# Patient Record
Sex: Female | Born: 2000 | Race: Black or African American | Hispanic: No | Marital: Single | Smoking: Never smoker
Health system: Southern US, Community
[De-identification: ages and names within clinical notes are randomized; demographics above are authoritative.]

## PROBLEM LIST (undated history)

## (undated) DIAGNOSIS — Z789 Other specified health status: Secondary | ICD-10-CM

## (undated) HISTORY — DX: Other specified health status: Z78.9

## (undated) HISTORY — PX: OTHER SURGICAL HISTORY: SHX169

---
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
## 2021-06-18 ENCOUNTER — Emergency Department (HOSPITAL_COMMUNITY)
Admission: EM | Admit: 2021-06-18 | Discharge: 2021-06-18 | Disposition: A | Discharge status: Home / Self Care | Payer: Medicaid Other | Attending: Physician Assistant | Admitting: Physician Assistant

## 2021-06-18 ENCOUNTER — Emergency Department (HOSPITAL_COMMUNITY): Payer: Medicaid Other

## 2021-06-18 ENCOUNTER — Other Ambulatory Visit: Payer: Self-pay

## 2021-06-18 ENCOUNTER — Encounter (HOSPITAL_COMMUNITY): Payer: Self-pay | Admitting: Emergency Medicine

## 2021-06-18 DIAGNOSIS — M542 Cervicalgia: Secondary | ICD-10-CM | POA: Insufficient documentation

## 2021-06-18 DIAGNOSIS — M546 Pain in thoracic spine: Secondary | ICD-10-CM | POA: Insufficient documentation

## 2021-06-18 DIAGNOSIS — R519 Headache, unspecified: Secondary | ICD-10-CM | POA: Diagnosis present

## 2021-06-18 DIAGNOSIS — Z5321 Procedure and treatment not carried out due to patient leaving prior to being seen by health care provider: Secondary | ICD-10-CM | POA: Diagnosis not present

## 2021-06-18 DIAGNOSIS — Y92410 Unspecified street and highway as the place of occurrence of the external cause: Secondary | ICD-10-CM | POA: Diagnosis not present

## 2021-06-18 NOTE — ED Notes (Signed)
Patient left on own accord °
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
## 2021-08-04 ENCOUNTER — Ambulatory Visit: Payer: Self-pay | Admitting: Nurse Practitioner
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
## 2022-05-10 ENCOUNTER — Ambulatory Visit: Payer: Medicaid Other | Admitting: Nurse Practitioner

## 2022-07-20 ENCOUNTER — Inpatient Hospital Stay (HOSPITAL_COMMUNITY): Payer: Medicaid Other

## 2022-07-20 ENCOUNTER — Inpatient Hospital Stay (HOSPITAL_COMMUNITY)
Admission: AD | Admit: 2022-07-20 | Discharge: 2022-07-20 | Disposition: A | Discharge status: Home / Self Care | Payer: Medicaid Other | Attending: Obstetrics and Gynecology | Admitting: Obstetrics and Gynecology

## 2022-07-20 ENCOUNTER — Encounter (HOSPITAL_COMMUNITY): Payer: Self-pay

## 2022-07-20 ENCOUNTER — Other Ambulatory Visit: Payer: Self-pay

## 2022-07-20 DIAGNOSIS — Z3490 Encounter for supervision of normal pregnancy, unspecified, unspecified trimester: Secondary | ICD-10-CM

## 2022-07-20 DIAGNOSIS — R1010 Upper abdominal pain, unspecified: Secondary | ICD-10-CM | POA: Diagnosis not present

## 2022-07-20 DIAGNOSIS — Z3A09 9 weeks gestation of pregnancy: Secondary | ICD-10-CM | POA: Insufficient documentation

## 2022-07-20 DIAGNOSIS — R109 Unspecified abdominal pain: Secondary | ICD-10-CM | POA: Diagnosis present

## 2022-07-20 DIAGNOSIS — O26891 Other specified pregnancy related conditions, first trimester: Secondary | ICD-10-CM | POA: Diagnosis present

## 2022-07-20 LAB — CBC
HCT: 37.6 % (ref 36.0–46.0)
Hemoglobin: 12.4 g/dL (ref 12.0–15.0)
MCH: 28.2 pg (ref 26.0–34.0)
MCHC: 33 g/dL (ref 30.0–36.0)
MCV: 85.5 fL (ref 80.0–100.0)
Platelets: 348 10*3/uL (ref 150–400)
RBC: 4.4 MIL/uL (ref 3.87–5.11)
RDW: 14.2 % (ref 11.5–15.5)
WBC: 7.7 10*3/uL (ref 4.0–10.5)
nRBC: 0 % (ref 0.0–0.2)

## 2022-07-20 LAB — WET PREP, GENITAL
Clue Cells Wet Prep HPF POC: NONE SEEN
Sperm: NONE SEEN
Trich, Wet Prep: NONE SEEN
WBC, Wet Prep HPF POC: <10 (ref <10)
Yeast Wet Prep HPF POC: NONE SEEN

## 2022-07-20 LAB — HIV ANTIBODY (ROUTINE TESTING W REFLEX): HIV Screen 4th Generation wRfx: NONREACTIVE

## 2022-07-20 LAB — POC URINE PREG, ED: Preg Test, Ur: POSITIVE — AB

## 2022-07-20 LAB — ABO/RH: ABO/RH(D): B POS

## 2022-07-20 LAB — HCG, QUANTITATIVE, PREGNANCY: hCG, Beta Chain, Quant, S: 211507 m[IU]/mL — ABNORMAL HIGH (ref <5)

## 2022-07-20 NOTE — ED Provider Notes (Signed)
[facility] EMERGENCY DEPARTMENT Provider Note   CSN: [id number] Arrival date & time: 07/20/22  1425     History  No chief complaint on file.   Alexis Gray is a 21 y.o. female.  HPI Previously well female presents with upper abdominal pain in the context of home diagnosed pregnancy.  Last menstrual period was September 7.  She took a home pregnancy test a few weeks after that was positive.  Now over the past 2 or 3 weeks she has had abdominal pain.  In her effort to seek care she was informed that she needs a hospital performed pregnancy test.  She presents today for that with concern for the ongoing abdominal pain.  No vomiting, no bleeding.    Home Medications Prior to Admission medications   Medication Sig Start Date End Date Taking? Authorizing Provider  ibuprofen (ADVIL,MOTRIN) 100 MG chewable tablet Chew 4 tablets (400 mg total) by mouth every 8 (eight) hours as needed. Take with food 11/30/15   Triplett, Tammy, PA-C  ondansetron (ZOFRAN ODT) 4 MG disintegrating tablet Take 1 tablet (4 mg total) by mouth every 8 (eight) hours as needed for nausea or vomiting. 10/16/20   Mare Ferrari, PA-C      Allergies    Patient has no known allergies.    Review of Systems   Review of Systems  All other systems reviewed and are negative.   Physical Exam Updated Vital Signs BP 113/63   Pulse 86   Temp 97.9 F (36.6 C) (Oral)   Resp 15   SpO2 99%  Physical Exam Vitals and nursing note reviewed.  Constitutional:      General: She is not in acute distress.    Appearance: She is well-developed.  HENT:     Head: Normocephalic and atraumatic.  Eyes:     Conjunctiva/sclera: Conjunctivae normal.  Cardiovascular:     Rate and Rhythm: Normal rate and regular rhythm.  Pulmonary:     Effort: Pulmonary effort is normal. No respiratory distress.     Breath sounds: Normal breath sounds. No stridor.  Abdominal:     General: There is no distension.    Skin:     General: Skin is warm and dry.  Neurological:     Mental Status: She is alert and oriented to person, place, and time.     Cranial Nerves: No cranial nerve deficit.  Psychiatric:        Mood and Affect: Mood normal.     ED Results / Procedures / Treatments   Labs (all labs ordered are listed, but only abnormal results are displayed) Labs Reviewed  POC URINE PREG, ED    Procedures Procedures    Medications Ordered in ED Medications - No data to display  ED Course/ Medical Decision Making/ A&P                           Medical Decision Making Previously well female first pregnancy 10 weeks by dates presents with ongoing abdominal pain no prior OB eval.  Patient is awake, alert, afebrile, hemodynamically unremarkable, and pain is upper not lower, lower suspicion for ectopic or threatened pregnancy, but without prior evaluation and discussed her case with our MAU and the patient was sent there for evaluation.  Initial physical exam, vital signs reassuring.  Amount and/or Complexity of Data Reviewed Labs:  Decision-making details documented in ED Course.  Risk Decision regarding hospitalization.   Final  Clinical Impression(s) / ED Diagnoses Final diagnoses:  Abdominal pain during pregnancy in first trimester    Rx / DC Orders ED Discharge Orders     None         Gerhard Munch, MD 07/20/22 1448

## 2022-07-20 NOTE — MAU Note (Signed)
Alexis Gray is a 21 y.o. at [redacted]w[redacted]d here in MAU reporting: states she needs pregnancy confirmation to get her first appointment in the office. Having some abdominal pain that started 3 weeks ago but worse today. No bleeding.   LMP: 04/29/22  Onset of complaint: ongoing  Pain score: 4/10  Vitals:   07/20/22 1433 07/20/22 1534  BP: 113/63 98/69  Pulse: 86 88  Resp: 15 16  Temp: 97.9 F (36.6 C) 98.6 F (37 C)  SpO2: 99% 97%     [name]  Lab orders placed from triage: labs ordered by provider

## 2022-07-20 NOTE — MAU Provider Note (Signed)
History     CSN: [id number]  Arrival date and time: 07/20/22 1425    Chief Complaint  Patient presents with   Abdominal Pain   HPI Ms. Sophira L Flitton is a 21 y.o. year old G1P0 female at [redacted]w[redacted]d weeks gestation who presents to MAU reporting she needs pregnancy confirmation so she can get scheduled for her 1st prenatal visit. She reports she started having abdominal pain 3 weeks ago, but it worsened today. Pain is rated 4/10. She denies VB or abnormal vaginal d/c. She reports her LMP was 04/29/2022.    OB History     Gravida  1   Para      Term      Preterm      AB      Living         SAB      IAB      Ectopic      Multiple      Live Births              History reviewed. No pertinent past medical history.  History reviewed. No pertinent surgical history.  History reviewed. No pertinent family history.  Social History   Tobacco Use   Smoking status: Never   Smokeless tobacco: Never  Substance Use Topics   Alcohol use: No   Drug use: No    Allergies: No Known Allergies  Medications Prior to Admission  Medication Sig Dispense Refill Last Dose   ibuprofen (ADVIL,MOTRIN) 100 MG chewable tablet Chew 4 tablets (400 mg total) by mouth every 8 (eight) hours as needed. Take with food 30 tablet 0    ondansetron (ZOFRAN ODT) 4 MG disintegrating tablet Take 1 tablet (4 mg total) by mouth every 8 (eight) hours as needed for nausea or vomiting. 20 tablet 0     Review of Systems  Constitutional: Negative.   HENT: Negative.    Eyes: Negative.   Respiratory: Negative.    Cardiovascular: Negative.   Gastrointestinal: Negative.   Endocrine: Negative.   Genitourinary:  Positive for pelvic pain.  Musculoskeletal: Negative.   Allergic/Immunologic: Negative.   Neurological: Negative.   Hematological: Negative.   Psychiatric/Behavioral: Negative.     Physical Exam   Blood pressure 98/69, pulse 88, temperature 98.6 F (37 C), temperature source Oral, resp. rate  16, height 5\' 5"  (1.651 m), weight 56 kg, last menstrual period 04/29/2022, SpO2 97 %.  Physical Exam Vitals and nursing note reviewed.  Cardiovascular:     Rate and Rhythm: Normal rate.  Pulmonary:     Effort: Pulmonary effort is normal.  Abdominal:     General: Abdomen is flat.  Genitourinary:    Comments: Swabs collected by self-swab Musculoskeletal:        General: Normal range of motion.  Skin:    General: Skin is warm and dry.  Neurological:     Mental Status: She is oriented to person, place, and time.  Psychiatric:        Mood and Affect: Mood normal.        Behavior: Behavior normal.        Thought Content: Thought content normal.        Judgment: Judgment normal.     MAU Course  Procedures  MDM CCUA UPT CBC ABO/Rh HCG Wet Prep GC/CT -- pending HIV -- pending OB < 14 wks 06/29/2022 with TV  Results for orders placed or performed during the hospital encounter of 07/20/22 (from the past 24 hour(s))  POC Urine Pregnancy, ED (not at [facility])     Status: Abnormal   Collection Time: 07/20/22  2:36 PM  Result Value Ref Range   Preg Test, Ur POSITIVE (A) NEGATIVE  CBC     Status: None   Collection Time: 07/20/22  3:41 PM  Result Value Ref Range   WBC 7.7 4.0 - 10.5 K/uL   RBC 4.40 3.87 - 5.11 MIL/uL   Hemoglobin 12.4 12.0 - 15.0 g/dL   HCT 29.9 24.2 - 68.3 %   MCV 85.5 80.0 - 100.0 fL   MCH 28.2 26.0 - 34.0 pg   MCHC 33.0 30.0 - 36.0 g/dL   RDW 41.9 62.2 - 29.7 %   Platelets 348 150 - 400 K/uL   nRBC 0.0 0.0 - 0.2 %  ABO/Rh     Status: None   Collection Time: 07/20/22  3:41 PM  Result Value Ref Range   ABO/RH(D) B POS    No rh immune globuloin      NOT A RH IMMUNE GLOBULIN CANDIDATE, PT RH POSITIVE Performed at [facility], [address], [place], [place] [address]   hCG, quantitative, pregnancy     Status: Abnormal   Collection Time: 07/20/22  3:41 PM  Result Value Ref Range   hCG, Beta Chain, Quant, S 211,507 (H) <5 mIU/mL  HIV Antibody (routine  testing w rflx)     Status: None   Collection Time: 07/20/22  3:41 PM  Result Value Ref Range   HIV Screen 4th Generation wRfx Non Reactive Non Reactive  Wet prep, genital     Status: None   Collection Time: 07/20/22  3:52 PM  Result Value Ref Range   Yeast Wet Prep HPF POC NONE SEEN NONE SEEN   Trich, Wet Prep NONE SEEN NONE SEEN   Clue Cells Wet Prep HPF POC NONE SEEN NONE SEEN   WBC, Wet Prep HPF POC <10 <10   Sperm NONE SEEN     US OB LESS THAN 14 WEEKS WITH OB TRANSVAGINAL  Result Date: 07/20/2022 CLINICAL DATA:  Abdominal pain during first trimester pregnancy. Estimated gestational age by LMP is 11 weeks 5 days. Quantitative beta HCG is pending. EXAM: OBSTETRIC <14 WK [place] AND TRANSVAGINAL OB US TECHNIQUE: Both transabdominal and transvaginal ultrasound examinations were performed for complete evaluation of the gestation as well as the maternal uterus, adnexal regions, and pelvic cul-de-sac. Transvaginal technique was performed to assess early pregnancy. COMPARISON:  None Available. FINDINGS: Intrauterine gestational sac: A single intrauterine gestational sac is identified. Yolk sac:  The yolk sac is present. Embryo:  Fetal pole is visualized. Cardiac Activity: Fetal cardiac activity is observed. Heart Rate: 166 bpm CRL:  23.2 mm   9 w   0 d                  [place] EDC: 02/19/2023 Subchorionic hemorrhage:  None visualized. Maternal uterus/adnexae: Uterus is anteverted. No myometrial mass lesions are identified. Both ovaries are visualized and appear normal. No abnormal adnexal mass lesions. Minimal free fluid in the pelvis. IMPRESSION: Single intrauterine pregnancy with estimated gestational age by crown-rump length of 9 weeks 0 days. No acute complication is suggested sonographically. Electronically Signed   By: Burman Nieves M.D.   On: 07/20/2022 16:51     Assessment and Plan  1. Abdominal pain during pregnancy in first trimester - Information provided on abdominal pain in pregnancy  2.  Intrauterine pregnancy - List of OB providers who take Medicaid  given - Advised to call and get scheduled to start [facility] at 10-12 weeks  3. [redacted] weeks gestation of pregnancy   - Discharge patient - Patient verbalized an understanding of the plan of care and agrees.    Raelyn Mora, CNM 07/20/2022, 6:05 PM

## 2022-07-20 NOTE — ED Triage Notes (Signed)
Patient here with abdominal pain and requesting pregnancy test. LMP approximately ten weeks ago.

## 2022-07-20 NOTE — Discharge Instructions (Signed)
[facility] Prov[facility]   [facility]             [address], [place], [place] [address] [phone]  [facility]                                                             [address], [address], [place], [place], [address] [phone]  [facility] [place]                                    [address], [address], [place], [place], [address] [phone]  [facility] [place] [address], [place], [place], [address] [phone]  [facility] [place]                                 [address], [place], [place], [address] [phone]  [facility]                                    [address], , [place], [address] [phone]  [facility] [address], [address], [place], [place], [address]                              [facility] [address], [place], [place], [address] [phone]  [facility]         Phone: [phone]  [facility]      Phone: [phone]   [facility]      Phone: [phone]  [facility]         Phone: [phone]   [facility]    Phone: [phone]  [facility]      Phone: [phone]  [facility]      Phone: [phone]  [facility]        Phone: [phone] 

## 2022-07-21 LAB — GC/CHLAMYDIA PROBE AMP (~~LOC~~) NOT AT ARMC
Chlamydia: NEGATIVE
Comment: NEGATIVE
Comment: NORMAL
Neisseria Gonorrhea: NEGATIVE

## 2022-08-10 ENCOUNTER — Ambulatory Visit: Payer: Medicaid Other | Admitting: Family Medicine

## 2022-08-10 ENCOUNTER — Telehealth: Payer: Self-pay | Admitting: General Practice

## 2022-08-10 NOTE — Telephone Encounter (Signed)
Pt was a no show 12/13 for a New Patient visit with Dr. Doreene Burke. This is her first, letter sent

## 2022-08-11 NOTE — Telephone Encounter (Signed)
no show for new pt visit, [employer] insurance, unable to reschedule with [facility]

## 2022-08-17 ENCOUNTER — Telehealth (INDEPENDENT_AMBULATORY_CARE_PROVIDER_SITE_OTHER): Payer: Medicaid Other

## 2022-08-17 DIAGNOSIS — Z3490 Encounter for supervision of normal pregnancy, unspecified, unspecified trimester: Secondary | ICD-10-CM | POA: Insufficient documentation

## 2022-08-17 DIAGNOSIS — Z3492 Encounter for supervision of normal pregnancy, unspecified, second trimester: Secondary | ICD-10-CM

## 2022-08-17 DIAGNOSIS — Z3689 Encounter for other specified antenatal screening: Secondary | ICD-10-CM

## 2022-08-17 MED ORDER — BLOOD PRESSURE KIT DEVI: 1.0000 | 0 refills | Status: DC | PRN

## 2022-08-17 MED ORDER — GOJJI WEIGHT SCALE MISC: 1.0000 | 0 refills | Status: DC | PRN

## 2022-08-17 NOTE — Patient Instructions (Addendum)
Pick up blood pressure cuff and weight scale at : [place]  Child birth classes at [email or web address] Safe Medications in Pregnancy   Acne:  Benzoyl Peroxide  Salicylic Acid   Backache/Headache:  Tylenol: 2 regular strength every 4 hours OR               2 Extra strength every 6 hours   Colds/Coughs/Allergies:  Benadryl (alcohol free) 25 mg every 6 hours as needed  Breath right strips  Claritin  Cepacol throat lozenges  Chloraseptic throat spray  Cold-Eeze- up to three times per day  Cough drops, alcohol free  Flonase (by prescription only)  Guaifenesin  Mucinex  Robitussin DM (plain only, alcohol free)  Saline nasal spray/drops  Sudafed (pseudoephedrine) & Actifed * use only after [redacted] weeks gestation and if you do not have high blood pressure  Tylenol  Vicks Vaporub  Zinc lozenges  Zyrtec   Constipation:  Colace  Ducolax suppositories  Fleet enema  Glycerin suppositories  Metamucil  Milk of magnesia  Miralax  Senokot  Smooth move tea   Diarrhea:  Kaopectate  Imodium A-D   *NO pepto Bismol   Hemorrhoids:  Anusol  Anusol HC  Preparation H  Tucks   Indigestion:  Tums  Maalox  Mylanta  Zantac  Pepcid   Insomnia:  Benadryl (alcohol free) 25mg  every 6 hours as needed  Tylenol PM  Unisom, no Gelcaps   Leg Cramps:  Tums  MagGel   Nausea/Vomiting:  Bonine  Dramamine  Emetrol  Ginger extract  Sea bands  Meclizine  Nausea medication to take during pregnancy:  Unisom (doxylamine succinate 25 mg tablets) Take one tablet daily at bedtime. If symptoms are not adequately controlled, the dose can be increased to a maximum recommended dose of two tablets daily (1/2 tablet in the morning, 1/2 tablet mid-afternoon and one at bedtime).  Vitamin B6 100mg  tablets. Take one tablet twice a day (up to 200 mg per day).   Skin Rashes:  Aveeno products  Benadryl cream or 25mg  every 6 hours as needed  Calamine Lotion   1% cortisone cream   Yeast infection:  Gyne-lotrimin 7  Monistat 7    **If taking multiple medications, please check labels to avoid duplicating the same active ingredients  **take medication as directed on the label  ** Do not exceed 4000 mg of tylenol in 24 hours  **Do not take medications that contain aspirin or ibuprofen         AREA PEDIATRIC/FAMILY [place] [phone]) [facility] Erin Hearing, MD; Gwendlyn Deutscher, MD; Walker Kehr, MD; Andria Frames, MD; McDiarmid, MD; Dutch Quint, MD; Nori Riis, MD; Mingo Amber, [place] [address] [place], [place] [address] [phone] Mon-Fri 8:30-12:30, 1:30-5:00 Providers come to see babies at [place] [facility] providers who accept newborns: Dorthy Cooler, MD; Orland Mustard, MD; Stephanie Acre, MD [place] [address], [place], [place] [address] [phone] Mon-Fri 8:00-5:30 Babies seen by providers at [facility] Does NOT accept Medicaid Please call early in hospitalization for appointment (limited availability)  [place], MD [address] [place], [place] [address] [phone] Mon, Tue, Thur, Fri 8:30-5:00, Wed 10:00-7:00 (closed 1-2pm) Babies seen by [facility] providers Accepting Medicaid [place], [place]. [place], [place],  [address] [phone] Mon-Fri 8:30-5:00, Sat 8:30-12:00 Provider comes to see babies at [facility] Accepting Medicaid Must have been referred from current patients or contacted office  prior to delivery [place] [facility] [place]) Owens Shark, MD; Tamera Punt, MD; Doneen Poisson, MD; Fatima Sanger, MD; Wynetta Emery, MD; Jess Barters, MD; Tami Ribas, MD; Herbert Moors, MD; Derrell Lolling, MD; Dorothyann Peng, MD; Lucious Groves, NP; Baldo Ash, NP [address], [place], [place] [address] [phone] Mon, [place] Poot, Thur, Fri 8:30-5:30,  Wed 9:30-5:30, Sat 8:30-12:30 Babies seen by [facility] providers Accepting Medicaid Only accepting infants of first-time parents or siblings of current patients Hospital discharge coordinator will make follow-up appointment Baltazar Najjar [address], [place], [place]  [address] [phone]   Fax - [facility]. [address], [place], [place]  [address] Phone - [phone]   Fax - [place] [address] [place], [place], [place]  [address] [phone]  [place] [phone]) [facility] Redmond Baseman, MD; Jacklynn Ganong, MD; Torrie Mayers, MD; MD; Rosana Hoes, MD; Servando Salina, MD; Rose Fillers, MD; Rex Kras, MD; Corinna Capra, MD; Volney American, MD; Trilby Drummer, MD; Janann Colonel, MD; Jimmye Norman, [place], [place], [place] [address] [phone] Mon-Fri 8:30-5:00 (extended evenings Mon-Thur as needed), Sat-Sun 10:00-1:00 Providers come to see babies at [facility] Accepting Medicaid for families of first-time babies and families with all children in the household age 69 and under. Must register with office prior to making appointment (M-F only). [place], NP; Tomi Bamberger, MD; Redmond School, MD; [place], [place] [address] [place], [place] [address] [phone] Mon-Fri 8:00-5:00 Babies seen by providers at [facility] Does NOT accept Medicaid/Commercial Insurance Only [facility] [place] [facility]  Sabino Gasser, MD; Drema Dallas, MD; Montine Circle, MD; Vilma Prader, MD; Vanita Panda, MD; Alfonso Ramus, MD; Ruthann Cancer, MD; Roxanne Mins, MD; Rosalva Ferron, MD; Polly Cobia, MD [place]., [place], [place] [address] [phone] Mon-Fri 8:30-5:30, Sat (Oct.-Mar.) 9:00-1:00 Babies seen by providers at [place] [phone]) [place], MD; Suzan Slick, MD [address], [place], [place] [address] [phone] Mon-Fri 8:30-5:00, Sat  8:30-12:00 Providers come to see babies at [facility] Does NOT accept Medicaid [place] at Arlina Robes, [place]; Mannie Stabile, MD; [place], [place]; Nancy Fetter, MD; Moreen Fowler, Cornelius Pierre, [place], [place] [address] [phone] Mon-Fri 8:00-5:00 Babies seen by providers at [facility] Does NOT accept Medicaid Only accepting babies of parents who are patients Please call early in hospitalization for appointment (limited availability) [facility] Pediatricians Carlis Abbott, MD; Sharlene Motts, MD; Rod Can, MD; Warner Mccreedy, NP; Sabra Heck, MD; Ermalinda Memos, MD; Sharlett Iles, NP; Aurther Loft, MD; Jerrye Beavers, MD; Marcello Moores, MD; Berline Lopes, MD; Charolette Forward, MD [place]. [place], [place], [place] [address] [phone] Mon-Fri 8:00-5:00, Sat 9:00-12:00 Providers come to see babies at [facility] Does NOT accept [facility] [phone]) [place] at [facility] providers accepting new patients: Dayna Ramus, NP; Rolland Porter, [place], [place], [place] [address] [phone] Mon-Fri 8:00-5:00 Babies seen by providers at [facility] Does NOT accept Medicaid Only accepting babies of parents who are patients Please call early in hospitalization for appointment (limited availability) [facility] Abner Greenspan, MD; Sheran Lawless, MD [address] [place], [place] [address] [phone] (press 1 to schedule appointment) Mon-Fri 8:00-5:00 Providers come to see babies at [facility] Does NOT accept [facility], MD [address], [place], [place] [address] [phone] Mon-Fri 8:30-5:00 (lunch 12:30-1:00), extended hours by appointment only Wed 5:00-6:30 Babies seen by [facility] providers Accepting [facility] [place] at Nadeen Landau, MD; [place], MD; Ethlyn Gallery, [place], [place], [place] [address] [phone] Mon-Fri 8:00-5:00 Babies seen by [facility] providers Does NOT accept Medicaid [place]  at [place], MD; Yong Channel, MD; [place], [place] [address]., [place], [place] [address] [phone] Mon-Fri 8:00-5:00 Babies seen by [facility] providers Does NOT accept [facility] [place], [place]; [place], [place]; [place], [place]; Albertina Parr, MD; Frederic Jericho, MD; Ronney Lion, MD; Carlos Levering, NP; Jerelene Redden, NP; Tomasita Crumble, NP; Ronelle Nigh, NP; Corinna Lines, MD; [place], MD [address] [place], [place] [address] [phone] Mon-Fri 8:30-5:00, Sat 10:00-1:00 Providers come to see babies at [facility] Does NOT accept Medicaid Free prenatal information session Tuesdays at 4:45pm [place], MD; [place], [place]; [place], [place]; [place], [place]., [place] [address] [phone] Mon-Fri 7:30-5:30 Babies seen by [facility] [address] [place], [place], [place]  [address] [phone]   Fax - [phone]  [place] [phone] & [phone]) [facility] [address] [address] [place], [place] [address] [phone] Mon-Thur 8:00-6:00 Providers come to see babies at [place], NP; Melford Aase, MD; [place], [place]; [place], [place]., [place], [place] [address] [phone] Mon-Thur 7:30-7:30, Fri 7:30-4:30 Babies seen by [facility] providers Accepting [facility] Pediatrics Carolynn Sayers, MD; Cristino Martes, NP; Gertie Baron, MD [place], [place], [place] [address] [phone] Mon-Fri 8:30-5:00, Sat 8:30-12:00 Providers come to see babies at [facility] Accepting Medicaid Must have "Meet & Greet" appointment at office prior to delivery [place] ([place]) Faythe Dingwall, MD; Juleen China, MD; Clydene Laming, [place], [place], [place] [address] [phone] Mon-Wed 8:00-6:00, Thur-Fri 8:00-5:00, Sat 9:00-12:00 Providers come to see  babies at [facility] Does NOT accept Medicaid Only accepting siblings of current patients [facility], [employer], [place], [place]  [address] [phone]   Fax - [phone] [facility] [place]. [address], [place], [place]  [address] [phone]   Fax - [place] [phone] & [phone]) [place] at [facility], [place]; [place], [place] [address] [place], [place] [address] [phone] Mon-Fri 7:00-5:00 Babies seen by [facility] providers Does NOT accept Medicaid [place], MD; [place], [place]; [place], [place] [address], [place], [place] [address] [phone] Mon-Fri 8:00-5:00 Babies seen by [facility] providers Accepting [facility] Staves, MD; [place], [place]; [place], NP; [place], [place] [address] [place], [place], [place] [address] [phone] Mon-Fri 8:00-5:00 Babies seen by providers at [place] [phone]) [facility] at [place], [place] [address] Madelaine Bhat [place], [place] [address] [phone] Mon-Fri 8:00-5:00 Babies seen by [facility] providers Does NOT accept Medicaid Limited availability, please call early in hospitalization to schedule follow-up [facility] Kennedy Bucker, [place]; Maisie Fus, MD; Charlesetta Garibaldi, MD; [place], [place]; Jeannine Kitten, MD; [place], [place] [address], [place], [place] [address] [phone] Mon-Fri 8:30-5:00, Sat 9:00-12:00 Babies seen by providers at [facility] Accepting Medicaid Please register online then schedule online or call office [email or web address] [place] ([place] at [place]) Yong Channel, NP; Dwyane Dee, MD; Leonidas Romberg, [place] [address] [place], [place],   [address] [phone] Mon-Fri 8:00-5:00 Babies seen by providers at [place] ([place] [facility]) [place], MD; Rayvon Char, NP; Melina Modena, MD [phone], [place],  [place] [address] [phone] Mon-Fri 8:00-5:30, Sat&Sun by appointment (phones open at 8:30) Babies seen by [facility] providers Accepting Medicaid Must be a first-time baby or sibling of current patient [facility]  [address], [place], [place]  [address] [phone]   Fax - [phone]  [place] [address] [phone] & [phone]) [facility] [place], [place]; [place], [place]; [place], MD; [place], [place]; [place], MD [address], [place], [place] [address] [phone] Mon-Thur 8:00-7:00, Fri 8:00-5:00, Sat 8:00-12:00, Sun 9:00-12:00 Babies seen by [facility] providers Accepting Medicaid [facility] Medicine at Liana Gerold, MD; Gaynell Face, MD; [facility], MD [address], [place], [place] [address] [phone] Mon-Thur 8:00-5:00 Babies seen by providers at [facility] Accepting Medicaid [facility] Medicine at Dorthey Sawyer, MD; Coe[place]Go[place], MD; Madilyn Fireman, MD; Melvyn Neth, MD; [place], MD; Lazarus Salines, MD; Gaynell Face, MD; Berneda Rose, MD; Flora Lipps, MD; Beryl Meager, MD; Luther Redo, MD; Lavonia Drafts, MD; Kellie Simmering, MD [address] Sherian Maroon [place], [place] [address] [phone] Mon-Fri 8:00-5:30, Sat (Oct.-Mar.) 9:00-1:00 Babies seen by providers at [facility] Accepting Medicaid Must fill out new patient packet, available online at [email or web address] - [place] [facility] at [facility]) Spero Geralds, NP; Tiburcio Pea, NP; Tresa Endo, NP; Whitney Post, MD; [place], [place]; Hennie Duos, MD; [place], MD; Kavin Leech, NP [address], [place], [place] [address] [phone] Mon-Thur 8:00-5:30, Fri 8:00-5:00 Babies seen by providers at  [facility] Accepting [facility]  [place] [phone]) [facility] [place], [place]; [place], MD; [place], MD; [place], [place] [address] [place], [place] [address] [phone] Mon-Fri 8:00-5:00 Babies seen by providers at [facility] Accepting [facility]   [place] [phone]) [facility] at [facility], [place]; Lenise Arena, MD; [place], [place] [address], [place], [place] [address] [phone] Mon-Fri 8:00-5:00 Babies seen by providers at [facility] Does NOT accept Medicaid Limited appointment availability, please call early in hospitalization  [facility], [place]; [place], MD [address] [place], [place] [address] [phone] Mon-Fri 8:00-5:00 Babies seen by [facility] providers Does NOT accept [facility] - [facility], MD; Ninetta Lights, MD; [place], [place]; [place], MD [address] [facility] [address], [place], [place] [address] [phone] Mon-Fri 8:00-5:00 After hours clinic [facility] [place], [place] [address]) [phone] Mon-Fri 5:00-8:00, Sat 12:00-6:00, Sun 10:00-4:00 Babies seen by [facility] providers Accepting [facility] at [place] [facility] [address], [place], [place]  [address] [phone]   Fax - [phone]  [place] [phone]) Adult nurse HealthCare at [facility], MD [address] [place], [place], [place] [address] [phone] Mon-Fri 8:00-5:00 Babies seen by [facility] providers Does NOT accept Medicaid [facility] - [place] [facility] at [place]) Rene Kocher, MD [address] [place] [address] [place], [place] [address] [phone] Mon-Thur 8:00-7:00, Fri 8:00-5:00, Sat 8:00-12:00 Babies seen by providers at [facility] Accepting Medicaid - but does not have vaccinations in office (must be received elsewhere) Limited availability, please call early in hospitalization  [place]  [phone]) [facility]  Wyvonne Lenz, MD [address] [place] [address] [phone]  Fax [phone]  [facility]  Lyndel Safe, MD, [place], [place], [place], [place] [address] [place], [place] [address] [phone] [facility]  [address] Sherian [place], [place] [address] [phone] [address] [place], [place] [address] [phone]  Office)  [facility] [address] [place], [place] [address] [place], [place], [place] [address] [phone] [facility] [address] [place], [place], [place] [address] [phone] [facility] [address] [place], [place] [address] [place], [place], [place] [address] [facility] [address], [place], [place] [address] [id number] [place]. [address] [place], [place] [address] [phone] Dr. Okey Regal. [place] [address] [place], [place] [address] [facility] [address] [place] [address], [place], [place] [address] [facility] [address] [place], [place] [address] [phone]

## 2022-08-17 NOTE — Progress Notes (Signed)
New OB Intake  I connected with Alexis Gray on 08/17/22 at  9:15 AM EST by MyChart Video Visit and verified that I am speaking with the correct person using two identifiers. Nurse is located at [facility] and pt is located at home.  I discussed the limitations, risks, security and privacy concerns of performing an evaluation and management service by telephone and the availability of in person appointments. I also discussed with the patient that there may be a patient responsible charge related to this service. The patient expressed understanding and agreed to proceed.  I explained I am completing New OB Intake today. We discussed EDD of 02/22/2023 that is based on fist trimester ultrasound on 07/20/2022. Pt is G1/P0. I reviewed her allergies, medications, Medical/Surgical/OB history, and appropriate screenings. I informed her of [facility] services. [facility] information placed in AVS. Based on history, this is a low risk pregnancy.  There are no problems to display for this patient.   Concerns addressed today  Delivery Plans [facility]. Patient given information for [facility] Healthy Baby website for more information about Women's [facility] Children[facility] Center. Patient is not interested in water birth. Offered upcoming OB visit with CNM to discuss further.  MyChart/Babyscripts MyChart access verified. I explained pt will have some visits in office and some virtually. Babyscripts instructions given and order placed. Patient verifies receipt of registration text/e-mail. Account successfully created and app downloaded.  Blood Pressure Cuff/Weight Scale Blood pressure cuff ordered for patient to pick-up from [employer]. Explained after first prenatal appt pt will check weekly and document in Babyscripts. Patient does not have weight scale; order sent to [facility], patient may track weight weekly in Babyscripts.  Anatomy US Explained first scheduled [place] will be around 19 weeks. Anatomy US  scheduled for 09/28/2022 at 1:00. Pt notified to arrive at 12:45.  Labs Discussed Avelina Laine genetic screening with patient. Would like both Panorama and Horizon drawn at new OB visit. Routine prenatal labs needed.  COVID Vaccine Patient has not had COVID vaccine.   Is patient a CenteringPregnancy candidate?  Declined Declined due to Group setting   Is patient a Mom+Baby Combined Care candidate?  Not able to offer because not accepting patients for her due date   If accepted, Mom+Baby staff notified  Social Determinants of Health Food Insecurity: Patient denies food insecurity. WIC Referral: Patient is interested in referral to [facility].  Transportation: Patient denies transportation needs. Childcare: Discussed no children allowed at ultrasound appointments. Offered childcare services; patient declines childcare services at this time.  First visit review I reviewed new OB appt with patient. I explained they will have a provider visit that includes lab work. Explained pt will be seen by Dr. Mathews Robinsons at first visit; encounter routed to appropriate provider. Explained that patient will be seen by pregnancy navigator following visit with provider.   Lowry Bowl, CMA 08/17/2022  9:52 AM

## 2022-08-29 NOTE — L&D Delivery Note (Signed)
Delivery Note 22 y.o. G1P1001 at [redacted]w[redacted]d admitted for elective IOL.  At 1:09 PM a viable female was delivered via Vaginal, Spontaneous (Presentation: Right Occiput Anterior).  APGAR: 8, 9; weight 6 lb 10.2 oz (3010 g).   Placenta status: Spontaneous, Intact.  Cord: 3 vessels with the following complications: None.  Cord pH: not collected  Anesthesia: Epidural Episiotomy: None Lacerations: 2nd degree;Perineal Suture Repair: 2.0 vicryl Est. Blood Loss (mL): 327  Mom to postpartum.  Baby to Couplet care / Skin to Skin.  Sheppard Evens MD MPH OB Fellow, Faculty Practice [facility], Center for [facility] 02/17/2023

## 2022-09-02 ENCOUNTER — Ambulatory Visit (INDEPENDENT_AMBULATORY_CARE_PROVIDER_SITE_OTHER): Payer: Medicaid Other | Admitting: Advanced Practice Midwife

## 2022-09-02 ENCOUNTER — Other Ambulatory Visit (HOSPITAL_COMMUNITY)
Admission: RE | Admit: 2022-09-02 | Discharge: 2022-09-02 | Disposition: A | Discharge status: Home / Self Care | Payer: Medicaid Other | Source: Ambulatory Visit | Attending: Advanced Practice Midwife | Admitting: Advanced Practice Midwife

## 2022-09-02 ENCOUNTER — Other Ambulatory Visit: Payer: Self-pay

## 2022-09-02 ENCOUNTER — Encounter: Payer: Self-pay | Admitting: Advanced Practice Midwife

## 2022-09-02 VITALS — BP 104/65 | HR 75 | Wt 129.0 lb

## 2022-09-02 DIAGNOSIS — Z3492 Encounter for supervision of normal pregnancy, unspecified, second trimester: Secondary | ICD-10-CM | POA: Diagnosis present

## 2022-09-02 DIAGNOSIS — Z3A15 15 weeks gestation of pregnancy: Secondary | ICD-10-CM

## 2022-09-02 LAB — POCT URINALYSIS DIP (DEVICE)
Bilirubin Urine: NEGATIVE
Glucose, UA: NEGATIVE mg/dL
Hgb urine dipstick: NEGATIVE
Ketones, ur: NEGATIVE mg/dL
Nitrite: NEGATIVE
Protein, ur: NEGATIVE mg/dL
Specific Gravity, Urine: >=1.03 (ref 1.005–1.030)
Urobilinogen, UA: 0.2 mg/dL (ref 0.0–1.0)
pH: 5.5 (ref 5.0–8.0)

## 2022-09-02 MED ORDER — PRENATAL PLUS 27-1 MG PO TABS: 1.0000 | ORAL_TABLET | Freq: Every day | ORAL | 11 refills | Status: AC

## 2022-09-02 NOTE — Patient Instructions (Signed)

## 2022-09-02 NOTE — Progress Notes (Signed)
   INITIAL PRENATAL VISIT NOTE  Subjective:  Alexis Gray is a 22 y.o. G1P0 at [redacted]w[redacted]d being seen today for initial prenatal visit.  She is currently monitored for the following issues for this low-risk pregnancy and has Supervision of low-risk pregnancy on their problem list.  Patient reports headache.  Contractions: Not present. Vag. Bleeding: None.  Movement: Absent. Denies leaking of fluid.   The following portions of the patient's history were reviewed and updated as appropriate: allergies, current medications, past family history, past medical history, past social history, past surgical history and problem list.   Objective:   Vitals:   09/02/22 1041  BP: 104/65  Pulse: 75  Weight: 129 lb (58.5 kg)    Fetal Status: Fetal Heart Rate (bpm): 150 Fundal Height: 15 cm Movement: Absent     General:  Alert, oriented and cooperative. Patient is in no acute distress.  Skin: Skin is warm and dry. No rash noted.   Cardiovascular: Normal heart rate noted  Respiratory: Normal respiratory effort, no problems with respiration noted  Abdomen: Soft, gravid, appropriate for gestational age.  Pain/Pressure: Present     Pelvic: Cervical exam deferred        Extremities: Normal range of motion.  Edema: None  Mental Status: Normal mood and affect. Normal behavior. Normal judgment and thought content.   Assessment and Plan:  Pregnancy: G1P0 at [redacted]w[redacted]d 1. Encounter for supervision of low-risk pregnancy in second trimester - Culture, OB Urine - GC/Chlamydia probe amp ([place])not at [facility] - CBC/D/Plt+RPR+Rh+ABO+RubIgG... - Hemoglobin A1c - AFP, Serum, Open Spina Bifida - Panorama Prenatal Test Full Panel - [facility] Custom  2. [redacted] weeks gestation of pregnancy - pap deferred at this time due to patient's age.  Preterm labor symptoms and general obstetric precautions including but not limited to vaginal bleeding, contractions, leaking of fluid and fetal movement were reviewed in detail with the  patient. Please refer to After Visit Summary for other counseling recommendations.   Return in about 4 weeks (around 09/30/2022).  Future Appointments  Date Time Provider [place]  09/28/2022  1:00 PM [facility] US1 [facility]  10/03/2022 10:55 AM Autry-Lott, Naaman Plummer, DO [facility]    Marcille Buffy DNP, CNM  09/02/22  11:35 AM

## 2022-09-02 NOTE — Progress Notes (Signed)
Pt reports that she has been having a lot of Migrains, took Tylenol & it went away.

## 2022-09-04 LAB — CBC/D/PLT+RPR+RH+ABO+RUBIGG...
Antibody Screen: NEGATIVE
Basophils Absolute: 0 10*3/uL (ref 0.0–0.2)
Basos: 0 %
EOS (ABSOLUTE): 0 10*3/uL (ref 0.0–0.4)
Eos: 0 %
HCV Ab: NONREACTIVE
HIV Screen 4th Generation wRfx: NONREACTIVE
Hematocrit: 34.5 % (ref 34.0–46.6)
Hemoglobin: 11.4 g/dL (ref 11.1–15.9)
Hepatitis B Surface Ag: NEGATIVE
Immature Grans (Abs): 0 10*3/uL (ref 0.0–0.1)
Immature Granulocytes: 0 %
Lymphocytes Absolute: 1.9 10*3/uL (ref 0.7–3.1)
Lymphs: 26 %
MCH: 28.1 pg (ref 26.6–33.0)
MCHC: 33 g/dL (ref 31.5–35.7)
MCV: 85 fL (ref 79–97)
Monocytes Absolute: 0.5 10*3/uL (ref 0.1–0.9)
Monocytes: 7 %
Neutrophils Absolute: 4.9 10*3/uL (ref 1.4–7.0)
Neutrophils: 67 %
Platelets: 336 10*3/uL (ref 150–450)
RBC: 4.05 x10E6/uL (ref 3.77–5.28)
RDW: 13.9 % (ref 11.7–15.4)
RPR Ser Ql: NONREACTIVE
Rh Factor: POSITIVE
Rubella Antibodies, IGG: 12.6 index (ref >0.99)
WBC: 7.3 10*3/uL (ref 3.4–10.8)

## 2022-09-04 LAB — HCV INTERPRETATION

## 2022-09-04 LAB — AFP, SERUM, OPEN SPINA BIFIDA
AFP MoM: 0.74
AFP Value: 26.8 ng/mL
Gest. Age on Collection Date: 15 weeks
Maternal Age At EDD: 21 yr
OSBR Risk 1 IN: 10000
Test Results:: NEGATIVE
Weight: 129 [lb_av]

## 2022-09-04 LAB — URINE CULTURE, OB REFLEX

## 2022-09-04 LAB — HEMOGLOBIN A1C
Est. average glucose Bld gHb Est-mCnc: 105 mg/dL
Hgb A1c MFr Bld: 5.3 % (ref 4.8–5.6)

## 2022-09-04 LAB — CULTURE, OB URINE

## 2022-09-05 LAB — GC/CHLAMYDIA PROBE AMP (~~LOC~~) NOT AT ARMC
Chlamydia: NEGATIVE
Comment: NEGATIVE
Comment: NORMAL
Neisseria Gonorrhea: NEGATIVE

## 2022-09-09 LAB — PANORAMA PRENATAL TEST FULL PANEL:PANORAMA TEST PLUS 5 ADDITIONAL MICRODELETIONS: FETAL FRACTION: 7.7

## 2022-09-13 LAB — HORIZON CUSTOM: REPORT SUMMARY: POSITIVE — AB

## 2022-09-16 ENCOUNTER — Telehealth: Payer: Self-pay | Admitting: Lactation Services

## 2022-09-16 NOTE — Telephone Encounter (Signed)
Called patient in regards to [employer]. Received a message that call cannot be completed at this time. Will send My Chart Message.

## 2022-09-22 ENCOUNTER — Other Ambulatory Visit: Payer: Self-pay | Admitting: *Deleted

## 2022-09-22 DIAGNOSIS — D563 Thalassemia minor: Secondary | ICD-10-CM

## 2022-09-28 ENCOUNTER — Ambulatory Visit: Payer: Medicaid Other | Attending: Advanced Practice Midwife

## 2022-09-28 ENCOUNTER — Other Ambulatory Visit: Payer: Self-pay | Admitting: Advanced Practice Midwife

## 2022-09-28 DIAGNOSIS — Z3A19 19 weeks gestation of pregnancy: Secondary | ICD-10-CM | POA: Diagnosis not present

## 2022-09-28 DIAGNOSIS — Z3492 Encounter for supervision of normal pregnancy, unspecified, second trimester: Secondary | ICD-10-CM | POA: Diagnosis not present

## 2022-09-28 DIAGNOSIS — Z363 Encounter for antenatal screening for malformations: Secondary | ICD-10-CM | POA: Insufficient documentation

## 2022-09-28 DIAGNOSIS — D569 Thalassemia, unspecified: Secondary | ICD-10-CM | POA: Diagnosis not present

## 2022-09-28 DIAGNOSIS — O321XX0 Maternal care for breech presentation, not applicable or unspecified: Secondary | ICD-10-CM | POA: Diagnosis not present

## 2022-09-28 DIAGNOSIS — Z3490 Encounter for supervision of normal pregnancy, unspecified, unspecified trimester: Secondary | ICD-10-CM

## 2022-10-03 ENCOUNTER — Other Ambulatory Visit: Payer: Self-pay

## 2022-10-03 ENCOUNTER — Ambulatory Visit (INDEPENDENT_AMBULATORY_CARE_PROVIDER_SITE_OTHER): Payer: Medicaid Other | Admitting: Family Medicine

## 2022-10-03 VITALS — BP 105/62 | HR 83 | Wt 134.9 lb

## 2022-10-03 DIAGNOSIS — Z3A19 19 weeks gestation of pregnancy: Secondary | ICD-10-CM

## 2022-10-03 DIAGNOSIS — R519 Headache, unspecified: Secondary | ICD-10-CM

## 2022-10-03 DIAGNOSIS — D563 Thalassemia minor: Secondary | ICD-10-CM

## 2022-10-03 DIAGNOSIS — Z3492 Encounter for supervision of normal pregnancy, unspecified, second trimester: Secondary | ICD-10-CM

## 2022-10-03 NOTE — Progress Notes (Signed)
   PRENATAL VISIT NOTE  Subjective:  [place] is a 22 y.o. G1P0 at [redacted]w[redacted]d being seen today for ongoing prenatal care.  She is currently monitored for the following issues for this low-risk pregnancy and has Supervision of low-risk pregnancy on their problem list.  Patient reports  intermittent headaches, hx of migraine headaches. Endorses she is drinking a lot. No current headache .  Contractions: Irritability (cramping). Vag. Bleeding: None.  Movement: Present. Denies leaking of fluid.   The following portions of the patient's history were reviewed and updated as appropriate: allergies, current medications, past family history, past medical history, past social history, past surgical history and problem list.   Objective:   Vitals:   10/03/22 1112  BP: 105/62  Pulse: 83  Weight: 134 lb 14.4 oz (61.2 kg)    Fetal Status: Fetal Heart Rate (bpm): 147   Movement: Present     General:  Alert, oriented and cooperative. Patient is in no acute distress.  Skin: Skin is warm and dry. No rash noted.   Cardiovascular: Normal heart rate noted  Respiratory: Normal respiratory effort, no problems with respiration noted  Abdomen: Soft, gravid, appropriate for gestational age.  Pain/Pressure: Present (lower back)     Pelvic: Cervical exam deferred        Extremities: Normal range of motion.  Edema: None  Mental Status: Normal mood and affect. Normal behavior. Normal judgment and thought content.   Assessment and Plan:  Pregnancy: G1P0 at [redacted]w[redacted]d 1. Encounter for supervision of low-risk pregnancy in second trimester   2. [redacted] weeks gestation of pregnancy Follow up in 4 weeks  3. Frequent headaches Normal BP. Briefly discussed preeclampsia symptoms. Encouraged continued hydration.  4. Alpha thalassemia silent carrier GC appt scheduled as below. Patient reminded of this.    Preterm labor symptoms and general obstetric precautions including but not limited to vaginal bleeding, contractions,  leaking of fluid and fetal movement were reviewed in detail with the patient. Please refer to After Visit Summary for other counseling recommendations.   No follow-ups on file.  Future Appointments  Date Time Provider [place]  10/06/2022  3:00 PM [facility] GENETIC [facility] None    Naaman Plummer Autry-Lott, DO

## 2022-10-04 ENCOUNTER — Encounter: Payer: Self-pay | Admitting: Obstetrics and Gynecology

## 2022-10-06 ENCOUNTER — Telehealth: Payer: Self-pay | Admitting: Obstetrics and Gynecology

## 2022-10-06 ENCOUNTER — Ambulatory Visit: Payer: Medicaid Other | Attending: Maternal & Fetal Medicine

## 2022-10-06 ENCOUNTER — Other Ambulatory Visit: Payer: Self-pay

## 2022-10-06 ENCOUNTER — Ambulatory Visit: Payer: No Typology Code available for payment source

## 2022-10-06 NOTE — Telephone Encounter (Signed)
No show for virtual genetic counseling.  PC to offer to help with login or reschedule.  No answer.  Pt can reach out if she would like to reschedule.  We can be reached at [phone] or [phone].  Wilburt Finlay, MS, CGC

## 2022-11-01 ENCOUNTER — Ambulatory Visit (INDEPENDENT_AMBULATORY_CARE_PROVIDER_SITE_OTHER): Payer: Medicaid Other | Admitting: Advanced Practice Midwife

## 2022-11-01 ENCOUNTER — Encounter: Payer: Self-pay | Admitting: Advanced Practice Midwife

## 2022-11-01 ENCOUNTER — Other Ambulatory Visit (HOSPITAL_COMMUNITY)
Admission: RE | Admit: 2022-11-01 | Discharge: 2022-11-01 | Disposition: A | Discharge status: Home / Self Care | Payer: Medicaid Other | Source: Ambulatory Visit | Attending: Advanced Practice Midwife | Admitting: Advanced Practice Midwife

## 2022-11-01 ENCOUNTER — Other Ambulatory Visit: Payer: Self-pay

## 2022-11-01 VITALS — BP 106/56 | HR 79 | Wt 143.0 lb

## 2022-11-01 DIAGNOSIS — N760 Acute vaginitis: Secondary | ICD-10-CM | POA: Diagnosis present

## 2022-11-01 DIAGNOSIS — Z3A23 23 weeks gestation of pregnancy: Secondary | ICD-10-CM

## 2022-11-01 DIAGNOSIS — Z3492 Encounter for supervision of normal pregnancy, unspecified, second trimester: Secondary | ICD-10-CM

## 2022-11-01 MED ORDER — TERCONAZOLE 0.4 % VA CREA: 1.0000 | TOPICAL_CREAM | Freq: Every day | VAGINAL | 0 refills | Status: DC

## 2022-11-01 NOTE — Progress Notes (Signed)
   PRENATAL VISIT NOTE  Subjective:  Alexis Gray is a 22 y.o. G1P0 at 65w6dbeing seen today for ongoing prenatal care.  She is currently monitored for the following issues for this low-risk pregnancy and has Supervision of low-risk pregnancy on their problem list.  Patient reports vaginal irritation and vaginal discharge .  Contractions: Not present. Vag. Bleeding: None.  Movement: Present. Denies leaking of fluid.   The following portions of the patient's history were reviewed and updated as appropriate: allergies, current medications, past family history, past medical history, past social history, past surgical history and problem list.   Objective:   Vitals:   11/01/22 1456  BP: (!) 106/56  Pulse: 79  Weight: 143 lb (64.9 kg)    Fetal Status: Fetal Heart Rate (bpm): 139 Fundal Height: 24 cm Movement: Present     General:  Alert, oriented and cooperative. Patient is in no acute distress.  Skin: Skin is warm and dry. No rash noted.   Cardiovascular: Normal heart rate noted  Respiratory: Normal respiratory effort, no problems with respiration noted  Abdomen: Soft, gravid, appropriate for gestational age.  Pain/Pressure: Absent     Pelvic: Cervical exam deferred        Extremities: Normal range of motion.  Edema: None  Mental Status: Normal mood and affect. Normal behavior. Normal judgment and thought content.   Assessment and Plan:  Pregnancy: G1P0 at 268w6d. Acute vaginitis - wet prep collected - RX terazol 7 as directed   2. [redacted] weeks gestation of pregnancy - GTT at next visit   Preterm labor symptoms and general obstetric precautions including but not limited to vaginal bleeding, contractions, leaking of fluid and fetal movement were reviewed in detail with the patient. Please refer to After Visit Summary for other counseling recommendations.   Return in about 4 weeks (around 11/29/2022).  Future Appointments  Date Time Provider [place]4/08/2022  8:20 AM  [facility]4/08/2022  8:55 AM ArWoodroe ModeMD [facility]NP, CNM  11/01/22  3:26 PM

## 2022-11-02 LAB — CERVICOVAGINAL ANCILLARY ONLY
Bacterial Vaginitis (gardnerella): NEGATIVE
Candida Glabrata: NEGATIVE
Candida Vaginitis: POSITIVE — AB
Chlamydia: NEGATIVE
Comment: NEGATIVE
Comment: NEGATIVE
Comment: NEGATIVE
Comment: NEGATIVE
Comment: NEGATIVE
Comment: NORMAL
Neisseria Gonorrhea: NEGATIVE
Trichomonas: NEGATIVE

## 2022-11-09 ENCOUNTER — Other Ambulatory Visit: Payer: Self-pay

## 2022-11-09 ENCOUNTER — Encounter (HOSPITAL_COMMUNITY): Payer: Self-pay | Admitting: Obstetrics and Gynecology

## 2022-11-09 ENCOUNTER — Inpatient Hospital Stay (HOSPITAL_COMMUNITY)
Admission: AD | Admit: 2022-11-09 | Discharge: 2022-11-10 | Disposition: A | Discharge status: Home / Self Care | Payer: Medicaid Other | Attending: Obstetrics and Gynecology | Admitting: Obstetrics and Gynecology

## 2022-11-09 DIAGNOSIS — O26892 Other specified pregnancy related conditions, second trimester: Secondary | ICD-10-CM | POA: Diagnosis not present

## 2022-11-09 DIAGNOSIS — Z3A25 25 weeks gestation of pregnancy: Secondary | ICD-10-CM | POA: Diagnosis not present

## 2022-11-09 DIAGNOSIS — R109 Unspecified abdominal pain: Secondary | ICD-10-CM | POA: Diagnosis not present

## 2022-11-09 DIAGNOSIS — R1030 Lower abdominal pain, unspecified: Secondary | ICD-10-CM | POA: Insufficient documentation

## 2022-11-09 DIAGNOSIS — O26899 Other specified pregnancy related conditions, unspecified trimester: Secondary | ICD-10-CM

## 2022-11-09 NOTE — MAU Provider Note (Signed)
History    CSN: [id number]  Arrival date and time: 11/09/22 2318   Event Date/Time   First Provider Initiated Contact with Patient 11/09/22 2352     Chief Complaint  Patient presents with   Abdominal Pain   Decreased Fetal Movement   HPI  Alexis Gray is a 22 y.o. G1P0 at 7w0dwho presents for evaluation of lower abdominal cramping. Patient reports she is having intermittent lower abdominal cramping all evening.  Patient rates the pain as a 4/10 and has not tried anything for the pain. She denies any vaginal bleeding, discharge, and leaking of fluid. Denies any constipation, diarrhea or any urinary complaints. Reports less fetal movement over the last 2 days.   OB History     Gravida  1   Para      Term      Preterm      AB      Living         SAB      IAB      Ectopic      Multiple      Live Births              Past Medical History:  Diagnosis Date   Medical history non-contributory     Past Surgical History:  Procedure Laterality Date   windsom tooth      Family History  Problem Relation Age of Onset   Healthy Mother    Healthy Father     Social History   Tobacco Use   Smoking status: Never   Smokeless tobacco: Never  Vaping Use   Vaping Use: Never used  Substance Use Topics   Alcohol use: No   Drug use: No    Allergies: No Known Allergies  Medications Prior to Admission  Medication Sig Dispense Refill Last Dose   Blood Pressure Monitoring (BLOOD PRESSURE KIT) DEVI 1 each by Does not apply route as needed. 1 each 0    Misc. Devices ([place] MISC 1 each by Does not apply route as needed. (Patient not taking: Reported on 10/03/2022) 1 each 0    prenatal vitamin w/FE, FA (PRENATAL 1 + 1) 27-1 MG TABS tablet Take 1 tablet by mouth daily at 12 noon. 30 tablet 11    terconazole (TERAZOL 7) 0.4 % vaginal cream Place 1 applicator vaginally at bedtime. 45 g 0     Review of Systems  Constitutional: Negative.  Negative for  fatigue and fever.  HENT: Negative.    Respiratory: Negative.  Negative for shortness of breath.   Cardiovascular: Negative.  Negative for chest pain.  Gastrointestinal:  Positive for abdominal pain. Negative for constipation, diarrhea, nausea and vomiting.  Genitourinary: Negative.  Negative for dysuria, vaginal bleeding and vaginal discharge.  Neurological: Negative.  Negative for dizziness and headaches.   Physical Exam   Blood pressure 109/65, pulse 94, temperature 98.4 F (36.9 C), temperature source Oral, resp. rate 18, height '5\' 5"'$  (1.651 m), weight 67 kg, last menstrual period 04/29/2022, SpO2 100 %.  Patient Vitals for the past 24 hrs:  BP Temp Temp src Pulse Resp SpO2 Height Weight  11/09/22 2333 109/65 98.4 F (36.9 C) Oral 94 18 100 % '5\' 5"'$  (1.651 m) 67 kg    Physical Exam Vitals and nursing note reviewed.  Constitutional:      General: She is not in acute distress.    Appearance: She is well-developed.  HENT:     Head: Normocephalic.  Eyes:     Pupils: Pupils are equal, round, and reactive to light.  Cardiovascular:     Rate and Rhythm: Normal rate and regular rhythm.     Heart sounds: Normal heart sounds.  Pulmonary:     Effort: Pulmonary effort is normal. No respiratory distress.     Breath sounds: Normal breath sounds.  Abdominal:     General: Bowel sounds are normal. There is no distension.     Palpations: Abdomen is soft.     Tenderness: There is no abdominal tenderness.  Skin:    General: Skin is warm and dry.  Neurological:     Mental Status: She is alert and oriented to person, place, and time.  Psychiatric:        Mood and Affect: Mood normal.        Behavior: Behavior normal.        Thought Content: Thought content normal.        Judgment: Judgment normal.     Fetal Tracing:  Baseline: 150 Variability: moderate Accels: 10x10 Decels: none  Toco: none   MAU Course  Procedures  Results for orders placed or performed during the  hospital encounter of 11/09/22 (from the past 24 hour(s))  Urinalysis, Routine w reflex microscopic -Urine, Clean Catch     Status: Abnormal   Collection Time: 11/09/22 11:44 PM  Result Value Ref Range   Color, Urine YELLOW YELLOW   APPearance HAZY (A) CLEAR   Specific Gravity, Urine 1.025 1.005 - 1.030   pH 6.0 5.0 - 8.0   Glucose, UA NEGATIVE NEGATIVE mg/dL   Hgb urine dipstick NEGATIVE NEGATIVE   Bilirubin Urine NEGATIVE NEGATIVE   Ketones, ur NEGATIVE NEGATIVE mg/dL   Protein, ur NEGATIVE NEGATIVE mg/dL   Nitrite NEGATIVE NEGATIVE   Leukocytes,Ua NEGATIVE NEGATIVE   MDM Labs ordered and reviewed.   UA Discussed cervical exam to rule out preterm labor- patient declines. Reviewed abdominal pain in pregnancy could be labor and discussed this is the way to ensure no preterm labor. Patient verbalized understanding and declines.   Offered pain medication for pain and patient declines.  CNM reviewed high specific gravity in urine and reviewed importance of PO hydration in pregnancy  Assessment and Plan   1. Abdominal cramping affecting pregnancy   2. [redacted] weeks gestation of pregnancy     -Discharge home in stable condition -Second trimester precautions discussed -Patient advised to follow-up with OB as scheduled for prenatal care -Patient may return to MAU as needed or if her condition were to change or worsen  Wende Mott, CNM 11/09/2022, 11:52 PM

## 2022-11-09 NOTE — MAU Note (Signed)
.  Alexis Gray is a 22 y.o. at [redacted]w[redacted]d here in MAU reporting: baby has not moved in 2 days, lower abdominal cramping that wraps around to her back that started last night. Denies VB or LOF. Reports right leg feels numb all the way down - just started on the way here.   Onset of complaint: 2 days  Pain score: 6 Vitals:   11/09/22 2333  BP: 109/65  Pulse: 94  Resp: 18  Temp: 98.4 F (36.9 C)  SpO2: 100%     FHT:143 Lab orders placed from triage:  UA

## 2022-11-09 NOTE — MAU Provider Note (Incomplete)
History    CSN: [id number]  Arrival date and time: 11/09/22 2318   Event Date/Time   First Provider Initiated Contact with Patient 11/09/22 2352     Chief Complaint  Patient presents with  . Abdominal Pain  . Decreased Fetal Movement   HPI  Alexis Gray is a 22 y.o. G1P0 at 62w0dwho presents for evaluation of lower abdominal cramping. Patient reports she is having intermittent lower abdominal cramping all evening.  Patient rates the pain as a 4/10 and has not tried anything for the pain. She denies any vaginal bleeding, discharge, and leaking of fluid. Denies any constipation, diarrhea or any urinary complaints. Reports less fetal movement over the last 2 days.   OB History     Gravida  1   Para      Term      Preterm      AB      Living         SAB      IAB      Ectopic      Multiple      Live Births              Past Medical History:  Diagnosis Date  . Medical history non-contributory     Past Surgical History:  Procedure Laterality Date  . windsom tooth      Family History  Problem Relation Age of Onset  . Healthy Mother   . Healthy Father     Social History   Tobacco Use  . Smoking status: Never  . Smokeless tobacco: Never  Vaping Use  . Vaping Use: Never used  Substance Use Topics  . Alcohol use: No  . Drug use: No    Allergies: No Known Allergies  Medications Prior to Admission  Medication Sig Dispense Refill Last Dose  . Blood Pressure Monitoring (BLOOD PRESSURE KIT) DEVI 1 each by Does not apply route as needed. 1 each 0   . Misc. Devices ([place] MISC 1 each by Does not apply route as needed. (Patient not taking: Reported on 10/03/2022) 1 each 0   . prenatal vitamin w/FE, FA (PRENATAL 1 + 1) 27-1 MG TABS tablet Take 1 tablet by mouth daily at 12 noon. 30 tablet 11   . terconazole (TERAZOL 7) 0.4 % vaginal cream Place 1 applicator vaginally at bedtime. 45 g 0     Review of Systems Physical Exam   Blood  pressure 109/65, pulse 94, temperature 98.4 F (36.9 C), temperature source Oral, resp. rate 18, height '5\' 5"'$  (1.651 m), weight 67 kg, last menstrual period 04/29/2022, SpO2 100 %.  Patient Vitals for the past 24 hrs:  BP Temp Temp src Pulse Resp SpO2 Height Weight  11/09/22 2333 109/65 98.4 F (36.9 C) Oral 94 18 100 % '5\' 5"'$  (1.651 m) 67 kg    Physical Exam  Fetal Tracing:  Baseline: Variability: Accels: Decels:  Toco:      MAU Course  Procedures  No results found for this or any previous visit (from the past 24 hour(s)).   No results found.   MDM Labs ordered and reviewed.   ***  CNM independently reviewed the imaging ordered. Imaging show ***  CNM consulted with Dr. *Marland Kitchenregarding presentation and results- MD recommends  Assessment and Plan  No diagnosis found.  -Discharge home in stable condition -Rx for *** -*** precautions discussed -Patient advised to follow-up with *** -Patient may return to MAU as needed  or if her condition were to change or worsen  Wende Mott, [place] 11/09/2022, 11:52 PM

## 2022-11-10 DIAGNOSIS — O26892 Other specified pregnancy related conditions, second trimester: Secondary | ICD-10-CM

## 2022-11-10 DIAGNOSIS — Z3A25 25 weeks gestation of pregnancy: Secondary | ICD-10-CM

## 2022-11-10 DIAGNOSIS — R109 Unspecified abdominal pain: Secondary | ICD-10-CM

## 2022-11-10 LAB — URINALYSIS, ROUTINE W REFLEX MICROSCOPIC
Bilirubin Urine: NEGATIVE
Glucose, UA: NEGATIVE mg/dL
Hgb urine dipstick: NEGATIVE
Ketones, ur: NEGATIVE mg/dL
Leukocytes,Ua: NEGATIVE
Nitrite: NEGATIVE
Protein, ur: NEGATIVE mg/dL
Specific Gravity, Urine: 1.025 (ref 1.005–1.030)
pH: 6 (ref 5.0–8.0)

## 2022-11-10 NOTE — Discharge Instructions (Signed)

## 2022-11-28 ENCOUNTER — Other Ambulatory Visit: Payer: Medicaid Other

## 2022-11-28 ENCOUNTER — Ambulatory Visit (INDEPENDENT_AMBULATORY_CARE_PROVIDER_SITE_OTHER): Payer: Medicaid Other | Admitting: Obstetrics & Gynecology

## 2022-11-28 ENCOUNTER — Other Ambulatory Visit: Payer: Self-pay

## 2022-11-28 VITALS — BP 107/72 | HR 80 | Wt 152.8 lb

## 2022-11-28 DIAGNOSIS — Z3492 Encounter for supervision of normal pregnancy, unspecified, second trimester: Secondary | ICD-10-CM

## 2022-11-28 DIAGNOSIS — Z3A27 27 weeks gestation of pregnancy: Secondary | ICD-10-CM

## 2022-11-28 DIAGNOSIS — D563 Thalassemia minor: Secondary | ICD-10-CM

## 2022-11-28 NOTE — Progress Notes (Unsigned)
   PRENATAL VISIT NOTE  Subjective:  [place] is a 22 y.o. G1P0 at [redacted]w[redacted]d being seen today for ongoing prenatal care.  She is currently monitored for the following issues for this low-risk pregnancy and has Supervision of low-risk pregnancy on their problem list.  Patient reports no complaints.  Contractions: Not present. Vag. Bleeding: None.  Movement: Present. Denies leaking of fluid.   The following portions of the patient's history were reviewed and updated as appropriate: allergies, current medications, past family history, past medical history, past social history, past surgical history and problem list.   Objective:   Vitals:   11/28/22 0852  BP: 107/72  Pulse: 80  Weight: 152 lb 12.8 oz (69.3 kg)    Fetal Status: Fetal Heart Rate (bpm): 150   Movement: Present     General:  Alert, oriented and cooperative. Patient is in no acute distress.  Skin: Skin is warm and dry. No rash noted.   Cardiovascular: Normal heart rate noted  Respiratory: Normal respiratory effort, no problems with respiration noted  Abdomen: Soft, gravid, appropriate for gestational age.  Pain/Pressure: Absent     Pelvic: Cervical exam deferred        Extremities: Normal range of motion.  Edema: None  Mental Status: Normal mood and affect. Normal behavior. Normal judgment and thought content.   Assessment and Plan:  Pregnancy: G1P0 at [redacted]w[redacted]d 1. Encounter for supervision of low-risk pregnancy in second trimester Routine 28 week testing  Preterm labor symptoms and general obstetric precautions including but not limited to vaginal bleeding, contractions, leaking of fluid and fetal movement were reviewed in detail with the patient. Please refer to After Visit Summary for other counseling recommendations.   Return in about 3 weeks (around 12/19/2022).  Future Appointments  Date Time Provider [place]  12/06/2022  9:30 AM Farrel Conners, MD [facility]    Emeterio Reeve, MD

## 2022-11-29 LAB — CBC
Hematocrit: 30.7 % — ABNORMAL LOW (ref 34.0–46.6)
Hemoglobin: 9.8 g/dL — ABNORMAL LOW (ref 11.1–15.9)
MCH: 27.1 pg (ref 26.6–33.0)
MCHC: 31.9 g/dL (ref 31.5–35.7)
MCV: 85 fL (ref 79–97)
Platelets: 340 10*3/uL (ref 150–450)
RBC: 3.62 x10E6/uL — ABNORMAL LOW (ref 3.77–5.28)
RDW: 12.2 % (ref 11.7–15.4)
WBC: 8.5 10*3/uL (ref 3.4–10.8)

## 2022-11-29 LAB — HIV ANTIBODY (ROUTINE TESTING W REFLEX): HIV Screen 4th Generation wRfx: NONREACTIVE

## 2022-11-29 LAB — GLUCOSE TOLERANCE, 2 HOURS W/ 1HR
Glucose, 1 hour: 63 mg/dL — ABNORMAL LOW (ref 70–179)
Glucose, 2 hour: 90 mg/dL (ref 70–152)
Glucose, Fasting: 81 mg/dL (ref 70–91)

## 2022-11-29 LAB — RPR: RPR Ser Ql: NONREACTIVE

## 2022-11-30 ENCOUNTER — Encounter: Payer: Self-pay | Admitting: *Deleted

## 2022-12-02 ENCOUNTER — Encounter: Payer: Self-pay | Admitting: Family Medicine

## 2022-12-02 ENCOUNTER — Ambulatory Visit (INDEPENDENT_AMBULATORY_CARE_PROVIDER_SITE_OTHER): Payer: Medicaid Other | Admitting: Family Medicine

## 2022-12-02 VITALS — BP 98/60 | HR 100 | Temp 98.1°F | Ht 67.0 in | Wt 148.4 lb

## 2022-12-02 DIAGNOSIS — Z3A28 28 weeks gestation of pregnancy: Secondary | ICD-10-CM | POA: Diagnosis not present

## 2022-12-02 DIAGNOSIS — O99012 Anemia complicating pregnancy, second trimester: Secondary | ICD-10-CM | POA: Diagnosis not present

## 2022-12-02 DIAGNOSIS — J069 Acute upper respiratory infection, unspecified: Secondary | ICD-10-CM

## 2022-12-02 NOTE — Progress Notes (Signed)
New Patient Office Visit  Subjective    Patient ID: Alexis Gray, female    DOB: 08-Dec-2000  Age: 22 y.o. MRN: [id number]  CC:  Chief Complaint  Patient presents with   Establish Care    HPI Alexis Gray presents to establish care Pt reports she thinks se is running a little fever, states she woke up with a fever, stuffy nose. Pt reports her legs are feeling a bit numb, states that when she gets under the covers she starts to sweat. Reports a sore throat today. Some coughing last night, was coughing so bad she threw up last night. No itchy or watery eyes, no allergy symptoms or issues.   Pt reports she is taking her prenatal vitamins, however was told she was anemic after her last set of bloodwork.   She is not a smoker, no prior history of asthma or lung issues.   Pt is currently pregnant, her due date June 23rd. She reports that so far she has had a normal pregnancy course. Pt reports she is having a boy, this is her first pregnancy.  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   Outpatient Encounter Medications as of 12/02/2022  Medication Sig   Blood Pressure Monitoring (BLOOD PRESSURE KIT) DEVI 1 each by Does not apply route as needed.   Misc. Devices (GOJJI WEIGHT SCALE) MISC 1 each by Does not apply route as needed.   prenatal vitamin w/FE, FA (PRENATAL 1 + 1) 27-1 MG TABS tablet Take 1 tablet by mouth daily at 12 noon.   No facility-administered encounter medications on file as of 12/02/2022.    Past Medical History:  Diagnosis Date   Medical history non-contributory     Past Surgical History:  Procedure Laterality Date   windsom tooth      Family History  Problem Relation Age of Onset   Healthy Mother    Healthy Father     Social History   Socioeconomic History   Marital status: Single    Spouse name: Not on file   Number of children:  Not on file   Years of education: Not on file   Highest education level: Not on file  Occupational History   Not on file  Tobacco Use   Smoking status: Never   Smokeless tobacco: Never  Vaping Use   Vaping Use: Never used  Substance and Sexual Activity   Alcohol use: No   Drug use: No   Sexual activity: Yes    Birth control/protection: None  Other Topics Concern   Not on file  Social History Narrative   Not on file   Social Determinants of Health   Financial Resource Strain: Not on file  Food Insecurity: No Food Insecurity (11/28/2022)   Hunger Vital Sign    Worried About Running Out of Food in the Last Year: Never true    Ran Out of Food in the Last Year: Never true  Transportation Needs: No Transportation Needs (11/28/2022)   PRAPARE - Administrator, Civil ServiceTransportation    Lack of Transportation (Medical): No    Lack of Transportation (Non-Medical): No  Physical Activity: Not on file  Stress: Not on file  Social Connections: Not on file  Intimate Partner Violence: Not on file    Review of Systems  All other systems reviewed and are negative.  Objective    BP 98/60 (BP Location: Right Arm, Patient Position: Sitting, Cuff Size: Normal)   Pulse 100   Temp 98.1 F (36.7 C) (Oral)   Ht 5\' 7"  (1.702 m)   Wt 148 lb 6.4 oz (67.3 kg)   LMP 04/29/2022   SpO2 98%   BMI 23.24 kg/m   Physical Exam Vitals reviewed.  Constitutional:      Appearance: Normal appearance. She is well-groomed and normal weight.  HENT:     Right Ear: Tympanic membrane and ear canal normal.     Left Ear: Tympanic membrane and ear canal normal.     Mouth/Throat:     Mouth: Mucous membranes are moist.     Pharynx: No posterior oropharyngeal erythema.  Eyes:     Conjunctiva/sclera: Conjunctivae normal.  Cardiovascular:     Rate and Rhythm: Normal rate and regular rhythm.     Pulses: Normal pulses.     Heart sounds: S1 normal and S2 normal.  Pulmonary:     Effort: Pulmonary effort is normal.      Breath sounds: Normal breath sounds and air entry.  Abdominal:     Comments: Gravid uterus  Musculoskeletal:     Right lower leg: No edema.     Left lower leg: No edema.  Lymphadenopathy:     Cervical: No cervical adenopathy.  Neurological:     Mental Status: She is alert and oriented to person, place, and time. Mental status is at baseline.     Gait: Gait is intact.  Psychiatric:        Mood and Affect: Mood and affect normal.        Speech: Speech normal.        Behavior: Behavior normal.        Judgment: Judgment normal.     Last CBC Lab Results  Component Value Date   WBC 8.5 11/28/2022   HGB 9.8 (L) 11/28/2022   HCT 30.7 (L) 11/28/2022   MCV 85 11/28/2022   MCH 27.1 11/28/2022   RDW 12.2 11/28/2022   PLT 340 11/28/2022        Assessment & Plan:   Problem List Items Addressed This Visit   None Visit Diagnoses     Viral upper respiratory tract infection    -  Primary   Anemia of pregnancy in second trimester         Physical exam findings are normal except for some nasal congestion. We discussed OTC medications like cetirizine or loratadine that are safe for use in pregnancy, reminded patient not to use NSAIDS for fever, tylenol only. RTC 2 months after her delivery.   Anemia of pregnancy could be physiologic, she reports no SOB, heart racing or increasing fatigue, will defer to the OB in charge of her care. Pt reports she is compliant with her prenatal vitamins and her appetite is good.   Return in about 6 months (around 05/29/2023) for annual physical exam.   Karie Georges, MD

## 2022-12-02 NOTE — Patient Instructions (Signed)
Cetirizine  or Loratadine 10 mg once daily -- safe in pregnancy  Tylenol 500 mg every 4-6 hours as needed for fever

## 2022-12-06 ENCOUNTER — Ambulatory Visit: Payer: Medicaid Other | Admitting: Family Medicine

## 2022-12-19 ENCOUNTER — Ambulatory Visit (INDEPENDENT_AMBULATORY_CARE_PROVIDER_SITE_OTHER): Payer: Medicaid Other | Admitting: Student

## 2022-12-19 ENCOUNTER — Other Ambulatory Visit: Payer: Self-pay

## 2022-12-19 VITALS — BP 100/63 | HR 81 | Wt 156.2 lb

## 2022-12-19 DIAGNOSIS — Z3A30 30 weeks gestation of pregnancy: Secondary | ICD-10-CM

## 2022-12-19 DIAGNOSIS — O99013 Anemia complicating pregnancy, third trimester: Secondary | ICD-10-CM

## 2022-12-19 DIAGNOSIS — D563 Thalassemia minor: Secondary | ICD-10-CM

## 2022-12-19 DIAGNOSIS — O99019 Anemia complicating pregnancy, unspecified trimester: Secondary | ICD-10-CM

## 2022-12-19 DIAGNOSIS — Z3493 Encounter for supervision of normal pregnancy, unspecified, third trimester: Secondary | ICD-10-CM

## 2022-12-19 MED ORDER — FERROUS SULFATE 325 (65 FE) MG PO TBEC: 325.0000 mg | DELAYED_RELEASE_TABLET | ORAL | 1 refills | Status: AC

## 2022-12-19 NOTE — Progress Notes (Signed)
   PRENATAL VISIT NOTE  Subjective:  Alexis Gray is a 22 y.o. G1P0 at [redacted]w[redacted]d being seen today for ongoing prenatal care.  She is currently monitored for the following issues for this low-risk pregnancy and has Supervision of low-risk pregnancy and Alpha thalassemia silent carrier on their problem list.  Patient reports no complaints.  Contractions: Irritability. Vag. Bleeding: None.  Movement: Present. Denies leaking of fluid.   The following portions of the patient's history were reviewed and updated as appropriate: allergies, current medications, past family history, past medical history, past social history, past surgical history and problem list.   Objective:   Vitals:   12/19/22 1038  BP: 100/63  Pulse: 81  Weight: 156 lb 3.2 oz (70.9 kg)    Fetal Status: Fetal Heart Rate (bpm): 140   Movement: Present     General:  Alert, oriented and cooperative. Patient is in no acute distress.  Skin: Skin is warm and dry. No rash noted.   Cardiovascular: Normal heart rate noted  Respiratory: Normal respiratory effort, no problems with respiration noted  Abdomen: Soft, gravid, appropriate for gestational age.  Pain/Pressure: Present     Pelvic: Cervical exam deferred        Extremities: Normal range of motion.  Edema: None  Mental Status: Normal mood and affect. Normal behavior. Normal judgment and thought content.   Assessment and Plan:  Pregnancy: G1P0 at [redacted]w[redacted]d 1. Encounter for supervision of low-risk pregnancy in third trimester - Doing well, reports frequent and vigorous fetal movement  2. [redacted] weeks gestation of pregnancy - continue routine follow-up care  3. Alpha thalassemia silent carrier  4. Anemia affecting pregnancy, antepartum - recommend taking iron supplement, RX provided  Preterm labor symptoms and general obstetric precautions including but not limited to vaginal bleeding, contractions, leaking of fluid and fetal movement were reviewed in detail with the  patient. Please refer to After Visit Summary for other counseling recommendations.   Return in about 2 weeks (around 01/02/2023) for LOB, IN-PERSON.  No future appointments.   Corlis Hove, NP

## 2023-01-02 ENCOUNTER — Ambulatory Visit (INDEPENDENT_AMBULATORY_CARE_PROVIDER_SITE_OTHER): Payer: Medicaid Other | Admitting: Student

## 2023-01-02 ENCOUNTER — Other Ambulatory Visit: Payer: Self-pay

## 2023-01-02 VITALS — BP 111/62 | HR 96 | Wt 162.6 lb

## 2023-01-02 DIAGNOSIS — Z3493 Encounter for supervision of normal pregnancy, unspecified, third trimester: Secondary | ICD-10-CM

## 2023-01-02 DIAGNOSIS — O99013 Anemia complicating pregnancy, third trimester: Secondary | ICD-10-CM

## 2023-01-02 DIAGNOSIS — Z3A32 32 weeks gestation of pregnancy: Secondary | ICD-10-CM

## 2023-01-02 DIAGNOSIS — D563 Thalassemia minor: Secondary | ICD-10-CM

## 2023-01-02 DIAGNOSIS — O99019 Anemia complicating pregnancy, unspecified trimester: Secondary | ICD-10-CM | POA: Insufficient documentation

## 2023-01-02 NOTE — Patient Instructions (Addendum)
[place] [facility] Providers  [place] ([id number]) [facility] Brown, MD; Chambliss, MD; Eniola, MD; Hensel, MD; McDiarmid, MD; McIntyer, MD [address] [place], [place] [address] [phone] Mon-Fri 8:30-12:30, 1:30-5:00  Providers come to see babies during newborn hospitalization Only accepting infants of Mother's who are seen at Family Medicine Center or have siblings seen at   [facility] Medicaid - Yes; Tricare - Yes   [facility] Mulberry, MD [address] [place], [place] [address] [phone] Mon, Tue, Thur, Fri 8:30-5:00, Wed 10:00-7:00 (closed 1-2pm daily for lunch) Takes [place] residents with no insurance.  [place] [facility] only with Medicaid/insurance; Tricare - no  [facility] Ben-Davies, MD; Brown, MD; Chandler, MD; Ettefagh, MD; Grant, MD; Hanvey, MD; Herrin, MD; Jones,  MD; Lester, MD; McCormick, MD; McQueen, MD; Simha, MD; Stanley, MD; Stryffeler, NP [address], [place], [place] [address] [phone] Mon, Tue, Thur, Fri 8:30-5:30, Wed 9:30-5:30, Sat 8:30-12:30 Only accepting infants of first-time parents or siblings of current patients Hospital discharge coordinator will make follow-up appointment Medicaid - yes; Tricare - yes  [place] ([address]) [facility] Cox, MD; Davis, MD; Dovico, MD; Ettefaugh, MD; Lowe, MD; Nation, MD; Slimp, MD; Sumner, MD; Williams, MD [address], [place], [place] [address] [phone] Mon-Fri 8:30-5:00, closed for lunch 12:30-1:30; Sat-Sun 10:00-1:00 Accepting Newborns with commercial insurance only, must call prior to delivery to be accepted into  practice.  Medicaid - no, Tricare - yes   [facility] [address] [place], [place] [address] [phone] or [phone] Mon to Fri 8am to 10pm, Sat 8am to 1pm  (virtual only on weekends) Only accepts Medicaid Healthy Blue pts  [facility]  Artis, MD; Coccaro, MD; Lockett Gardner, MD; Netherton, NP; Roper, MD; Wilmot, PA-C; Skinner, MD [address] [place], [place] [address] [phone] Mon-Fri 8:30-5:30 Medicaid - yes, Tricare - yes  [place] ([address]) [facility] Warner, MD [address], [place], [place] [address] [phone] Mon, Tues, Wed Fri 8:30-5:00, Sat 8:30-12:00, Closed Thursdays Accepting siblings of established patients and first time mom's if you call prenatally Medicaid- yes; Tricare - yes  [facility] Becker, PA; Hagler, MD; Quinn, PA-C; Scifres, PA; Sun, MD; Swayne, MD;  [address], [place], [place] [address] [phone] Mon-Fri 8:30-5:00, closed for lunch 1-2 Only accepting newborns of established patients Medicaid- no; Tricare - yes  [place] ([address]) [facility] Timberlake, MD; [address], [place], [place] [address] [phone] Mon-Fri 8:00-5:00 Medicaid - No; Tricare - Yes  [facility] [employer]  Brake, NP; Wharton, PA [address], [place], [place] [address] [phone] Mon-Fri 8:00-5:00 Medicaid - No, Tricare - Yes  [facility] Gay, MD; Quinlan, MD; Blatt, DNP [address] [place], [place] [address] [phone]  Mon-Fri 8:00-5:00 Medicaid - No; Tricare - Yes  [facility] [address] [place], [place] [address] [phone] Mon-Fri 8:30-5:00 (lunch 12:00-1:00) Medicaid -Yes; Tricare - Yes  [facility] Jordan, MD [address], , [place] [address] [phone] Mon-Fri 8:00-5:00 Seeing newborns of current patients only. No new patients Medicaid - No, Tricare - yes  [facility] Parker, MD [address] , [place] [address] [phone] Mon-Fri 8:00-5:00 Medicaid -yes as secondary coverage only;   Tricare - yes  [facility] Brecken, PA; Christy, NP; Dees, MD; DeClaire, MD; DeWeese, MD; Hodge, PA; Smoot, NP; Summer, MD; Vapne, MD [address] [place], [place] [address] [phone] Mon-Fri 8:30-5:00, Sat 9:00-11:00 Accepts commercial insurance ONLY. Offers free prenatal information sessions for families. Medicaid - No, Tricare - Call first  [facility] Bouska, MD; Gordon, PA; Jeffery, PA; Weber, PA [address] [place] [address] [phone] Mon-Fri 7:30-5:30 Medicaid - Yes; Tricare - yes  [place] ([address] & [address])  [facility] Reese, MD [address] [place], [place] [address] [phone] Mon-Thur 8:00-6:00, closed for lunch 12-2, closed Fridays Medicaid - yes; Tricare - no  [facility] Anderson, NP; Badger, MD; Beal, PA; Spencer, PA [address], [place], [place] [address] [phone] Mon-Fri 7:30-4:30 Medicaid - yes, Tricare - yes  [facility]  Agbuya, MD; Klett, NP; Romgoolam, MD; Rothstein, NP [address], [place], [place] [address] [phone] Mon-Fri 8:30-5:00, closed for lunch 1-2, Sat 8:30-12:00 - sick visits only Providers come to see babies at [facility] Only accepting newborns of siblings and first time parents ONLY if who have met with office prior to delivery Medicaid -Yes; Tricare - yes  [facility]  Golden, DO; Friddle, NP; Wallace, MD; Wood, MD:  [address], [place], [place] [address] [phone] Mon- Fri 8:00-5:00, Sat 9:00-12:00 - sick visits only Accepting siblings of established patients and first time mom/baby Medicaid - Yes; Tricare - yes Patients must have vaccinations (baby vaccines)  [place] ([address]  [address])  [facility] [address] [place], [place] [address] [phone] Mon-Fri 8:00-5:00 Medicaid - no; Tricare - yes  [facility] Briscoe, MD; Schmidt, PA; Moreira, PA [address], [place], [place] [address] [phone] Mon-Fri 8:00-5:00 Medicaid- yes; Tricare - yes  [facility] Boyd, MD; Jones, NP; Osborn, PA [address], ,  [address] [phone] Mon-Fri 8:00-5:00 Medicaid - Yes; Tricare - yes  

## 2023-01-02 NOTE — Progress Notes (Signed)
   PRENATAL VISIT NOTE  Subjective:  Alexis Gray is a 22 y.o. G1P0 at [redacted]w[redacted]d being seen today for ongoing prenatal care.  She is currently monitored for the following issues for this low-risk pregnancy and has Supervision of low-risk pregnancy; Alpha thalassemia silent carrier; [redacted] weeks gestation of pregnancy; and Anemia affecting pregnancy on their problem list.  Patient reports backache.  Contractions: Irritability. Vag. Bleeding: None.  Movement: Present. Denies leaking of fluid.   The following portions of the patient's history were reviewed and updated as appropriate: allergies, current medications, past family history, past medical history, past social history, past surgical history and problem list.   Objective:   Vitals:   01/02/23 1647  BP: 111/62  Pulse: 96  Weight: 162 lb 9.6 oz (73.8 kg)    Fetal Status: Fetal Heart Rate (bpm): 146   Movement: Present     General:  Alert, oriented and cooperative. Patient is in no acute distress.  Skin: Skin is warm and dry. No rash noted.   Cardiovascular: Normal heart rate noted  Respiratory: Normal respiratory effort, no problems with respiration noted  Abdomen: Soft, gravid, appropriate for gestational age.  Pain/Pressure: Present     Pelvic: Cervical exam deferred        Extremities: Normal range of motion.  Edema: None  Mental Status: Normal mood and affect. Normal behavior. Normal judgment and thought content.   Assessment and Plan:  Pregnancy: G1P0 at [redacted]w[redacted]d 1. Encounter for supervision of low-risk pregnancy in third trimester - Doing well overall - Frequent and vigorous fetal movement - Pregnancy support belt provided  2. [redacted] weeks gestation of pregnancy - continue biweekly follow-up  3. Anemia affecting pregnancy in third trimester - Patient not able to take PO iron, recommend IV iron infusion  4. Alpha thalassemia silent carrier    Preterm labor symptoms and general obstetric precautions including but not limited to  vaginal bleeding, contractions, leaking of fluid and fetal movement were reviewed in detail with the patient. Please refer to After Visit Summary for other counseling recommendations.   Return in about 2 weeks (around 01/16/2023) for LOB, IN-PERSON.  Future Appointments  Date Time Provider Department Center  01/19/2023 10:55 AM Corlis Hove, NP [facility]    Corlis Hove, NP

## 2023-01-03 ENCOUNTER — Telehealth: Payer: Self-pay

## 2023-01-03 NOTE — Telephone Encounter (Signed)
Iron infusion scheduled per Vonita Moss, NP for Thursday, 01/05/23. Called pt; phone number not valid. MyChart message sent.

## 2023-01-04 ENCOUNTER — Other Ambulatory Visit: Payer: Self-pay | Admitting: Student

## 2023-01-04 DIAGNOSIS — D563 Thalassemia minor: Secondary | ICD-10-CM

## 2023-01-04 DIAGNOSIS — Z3493 Encounter for supervision of normal pregnancy, unspecified, third trimester: Secondary | ICD-10-CM

## 2023-01-04 DIAGNOSIS — O99013 Anemia complicating pregnancy, third trimester: Secondary | ICD-10-CM

## 2023-01-05 ENCOUNTER — Encounter (HOSPITAL_COMMUNITY): Payer: Medicaid Other

## 2023-01-18 ENCOUNTER — Non-Acute Institutional Stay (HOSPITAL_COMMUNITY)
Admission: RE | Admit: 2023-01-18 | Discharge: 2023-01-18 | Disposition: A | Discharge status: Home / Self Care | Payer: Medicaid Other | Source: Ambulatory Visit | Attending: Internal Medicine | Admitting: Internal Medicine

## 2023-01-18 ENCOUNTER — Encounter (HOSPITAL_COMMUNITY): Payer: Self-pay | Admitting: Student

## 2023-01-18 DIAGNOSIS — Z3493 Encounter for supervision of normal pregnancy, unspecified, third trimester: Secondary | ICD-10-CM

## 2023-01-18 DIAGNOSIS — O99013 Anemia complicating pregnancy, third trimester: Secondary | ICD-10-CM

## 2023-01-18 DIAGNOSIS — D563 Thalassemia minor: Secondary | ICD-10-CM

## 2023-01-18 MED ORDER — SODIUM CHLORIDE 0.9 % IV BOLUS: 500.0000 mL | Freq: Once | INTRAVENOUS | Status: DC | PRN

## 2023-01-18 MED ORDER — SODIUM CHLORIDE 0.9 % IV SOLN: INTRAVENOUS | Status: DC | PRN

## 2023-01-18 MED ORDER — IRON SUCROSE 500 MG IVPB - SIMPLE MED
500.0000 mg | INTRAVENOUS | Status: DC
  Filled 2023-01-18: qty 275

## 2023-01-18 MED ORDER — DIPHENHYDRAMINE HCL 50 MG/ML IJ SOLN: 25.0000 mg | Freq: Once | INTRAMUSCULAR | Status: DC | PRN

## 2023-01-18 MED ORDER — METHYLPREDNISOLONE SODIUM SUCC 125 MG IJ SOLR: 125.0000 mg | Freq: Once | INTRAMUSCULAR | Status: DC | PRN

## 2023-01-18 MED ORDER — EPINEPHRINE PF 1 MG/ML IJ SOLN: 0.3000 mg | Freq: Once | INTRAMUSCULAR | Status: DC | PRN

## 2023-01-18 MED ORDER — ALBUTEROL SULFATE (2.5 MG/3ML) 0.083% IN NEBU: 2.5000 mg | INHALATION_SOLUTION | Freq: Once | RESPIRATORY_TRACT | Status: DC | PRN

## 2023-01-18 NOTE — Progress Notes (Signed)
Pt came to clinic today to receive Venofer infusion. Pt was unaware that infusion was 4 hours, and is unable to stay for infusion today. Pt advised to reschedule for a day that she will have time to stay. Pt rescheduled infusion for next Friday, May 31st at 830 AM. Fleet Contras, RN from [facility] notified that pt could not stay today for infusion.

## 2023-01-19 ENCOUNTER — Ambulatory Visit (INDEPENDENT_AMBULATORY_CARE_PROVIDER_SITE_OTHER): Payer: Medicaid Other | Admitting: Student

## 2023-01-19 ENCOUNTER — Other Ambulatory Visit: Payer: Self-pay

## 2023-01-19 VITALS — BP 108/62 | HR 84 | Wt 164.0 lb

## 2023-01-19 DIAGNOSIS — O368130 Decreased fetal movements, third trimester, not applicable or unspecified: Secondary | ICD-10-CM

## 2023-01-19 DIAGNOSIS — Z23 Encounter for immunization: Secondary | ICD-10-CM

## 2023-01-19 DIAGNOSIS — L299 Pruritus, unspecified: Secondary | ICD-10-CM

## 2023-01-19 DIAGNOSIS — O99013 Anemia complicating pregnancy, third trimester: Secondary | ICD-10-CM

## 2023-01-19 DIAGNOSIS — O99019 Anemia complicating pregnancy, unspecified trimester: Secondary | ICD-10-CM

## 2023-01-19 DIAGNOSIS — Z3A35 35 weeks gestation of pregnancy: Secondary | ICD-10-CM

## 2023-01-19 DIAGNOSIS — D563 Thalassemia minor: Secondary | ICD-10-CM

## 2023-01-19 DIAGNOSIS — Z3493 Encounter for supervision of normal pregnancy, unspecified, third trimester: Secondary | ICD-10-CM

## 2023-01-20 ENCOUNTER — Other Ambulatory Visit: Payer: Self-pay | Admitting: Student

## 2023-01-20 DIAGNOSIS — L299 Pruritus, unspecified: Secondary | ICD-10-CM

## 2023-01-20 LAB — COMPREHENSIVE METABOLIC PANEL
ALT: 11 IU/L (ref 0–32)
AST: 14 IU/L (ref 0–40)
Albumin/Globulin Ratio: 1.2 (ref 1.2–2.2)
Albumin: 3.6 g/dL — ABNORMAL LOW (ref 4.0–5.0)
Alkaline Phosphatase: 156 IU/L — ABNORMAL HIGH (ref 42–106)
BUN/Creatinine Ratio: 9 (ref 9–23)
BUN: 5 mg/dL — ABNORMAL LOW (ref 6–20)
Bilirubin Total: <0.2 mg/dL (ref 0.0–1.2)
CO2: 21 mmol/L (ref 20–29)
Calcium: 9.2 mg/dL (ref 8.7–10.2)
Chloride: 100 mmol/L (ref 96–106)
Creatinine, Ser: 0.56 mg/dL — ABNORMAL LOW (ref 0.57–1.00)
Globulin, Total: 2.9 g/dL (ref 1.5–4.5)
Glucose: 72 mg/dL (ref 70–99)
Potassium: 4.4 mmol/L (ref 3.5–5.2)
Sodium: 134 mmol/L (ref 134–144)
Total Protein: 6.5 g/dL (ref 6.0–8.5)
eGFR: 134 mL/min/{1.73_m2} (ref >59)

## 2023-01-20 LAB — BILE ACIDS, TOTAL: Bile Acids Total: 3.5 umol/L (ref 0.0–10.0)

## 2023-01-20 NOTE — Progress Notes (Signed)
   PRENATAL VISIT NOTE  Subjective:  Alexis Gray is a 22 y.o. G1P0 at [redacted]w[redacted]d being seen today for ongoing prenatal care.  She is currently monitored for the following issues for this low-risk pregnancy and has Supervision of low-risk pregnancy; Alpha thalassemia silent carrier; [redacted] weeks gestation of pregnancy; and Anemia affecting pregnancy on their problem list.  Patient reports  itching to palms and feet with some upper abdomen discomfort .  Contractions: Not present. Vag. Bleeding: None.  Movement: (!) Decreased. Denies leaking of fluid.   The following portions of the patient's history were reviewed and updated as appropriate: allergies, current medications, past family history, past medical history, past social history, past surgical history and problem list.   Objective:   Vitals:   01/19/23 1108  BP: 108/62  Pulse: 84  Weight: 164 lb (74.4 kg)    Fetal Status: Fetal Heart Rate (bpm): 140   Movement: (!) Decreased     General:  Alert, oriented and cooperative. Patient is in no acute distress.  Skin: Skin is warm and dry. No rash noted.   Cardiovascular: Normal heart rate noted  Respiratory: Normal respiratory effort, no problems with respiration noted  Abdomen: Soft, gravid, appropriate for gestational age.  Pain/Pressure: Present     Pelvic: Cervical exam deferred        Extremities: Normal range of motion.  Edema: None  Mental Status: Normal mood and affect. Normal behavior. Normal judgment and thought content.   Assessment and Plan:  Pregnancy: G1P0 at [redacted]w[redacted]d 1. Encounter for supervision of low-risk pregnancy in third trimester - Discussed kick counts - Tdap vaccine greater than or equal to 7yo IM - Bile acids, total - Comp Met (CMET)  2. [redacted] weeks gestation of pregnancy - weekly follow-up - swabs next visit  3. Alpha thalassemia silent carrier   4. Anemia affecting pregnancy, antepartum - IV iron next week  5. Itching - on palms and feet with mild epigastric  pain - Bile acids, total - Comp Met (CMET)  6. Decreased fetal movements in third trimester, single or unspecified fetus - Reactive - Fetal nonstress test; Future  Preterm labor symptoms and general obstetric precautions including but not limited to vaginal bleeding, contractions, leaking of fluid and fetal movement were reviewed in detail with the patient. Please refer to After Visit Summary for other counseling recommendations.   No follow-ups on file.  Future Appointments  Date Time Provider Department Center  01/27/2023  8:30 AM [facility] None  02/01/2023 12:15 PM Klett, Pascal Lux, NP [facility]  02/02/2023  1:55 PM Adam Phenix, MD [facility]    Corlis Hove, NP

## 2023-01-27 ENCOUNTER — Non-Acute Institutional Stay (HOSPITAL_COMMUNITY)
Admission: RE | Admit: 2023-01-27 | Discharge: 2023-01-27 | Disposition: A | Discharge status: Home / Self Care | Payer: Medicaid Other | Source: Ambulatory Visit | Attending: Internal Medicine | Admitting: Internal Medicine

## 2023-01-27 VITALS — BP 104/57 | HR 88 | Temp 97.8°F | Resp 16

## 2023-01-27 DIAGNOSIS — O99013 Anemia complicating pregnancy, third trimester: Secondary | ICD-10-CM | POA: Insufficient documentation

## 2023-01-27 DIAGNOSIS — D563 Thalassemia minor: Secondary | ICD-10-CM

## 2023-01-27 DIAGNOSIS — Z3A32 32 weeks gestation of pregnancy: Secondary | ICD-10-CM

## 2023-01-27 DIAGNOSIS — Z3493 Encounter for supervision of normal pregnancy, unspecified, third trimester: Secondary | ICD-10-CM

## 2023-01-27 LAB — CBC
HCT: 31 % — ABNORMAL LOW (ref 36.0–46.0)
Hemoglobin: 9.5 g/dL — ABNORMAL LOW (ref 12.0–15.0)
MCH: 24.5 pg — ABNORMAL LOW (ref 26.0–34.0)
MCHC: 30.6 g/dL (ref 30.0–36.0)
MCV: 80.1 fL (ref 80.0–100.0)
Platelets: 412 10*3/uL — ABNORMAL HIGH (ref 150–400)
RBC: 3.87 MIL/uL (ref 3.87–5.11)
RDW: 15.2 % (ref 11.5–15.5)
WBC: 9.2 10*3/uL (ref 4.0–10.5)
nRBC: 0 % (ref 0.0–0.2)

## 2023-01-27 LAB — IRON AND TIBC
Iron: 37 ug/dL (ref 28–170)
Saturation Ratios: 5 % — ABNORMAL LOW (ref 10.4–31.8)
TIBC: 682 ug/dL — ABNORMAL HIGH (ref 250–450)
UIBC: 645 ug/dL

## 2023-01-27 LAB — FERRITIN: Ferritin: 6 ng/mL — ABNORMAL LOW (ref 11–307)

## 2023-01-27 MED ORDER — IRON SUCROSE 500 MG IVPB - SIMPLE MED
500.0000 mg | Freq: Once | INTRAVENOUS | Status: AC
  Administered 2023-01-27: 500 mg via INTRAVENOUS
  Filled 2023-01-27: qty 500

## 2023-01-27 MED ORDER — DIPHENHYDRAMINE HCL 25 MG PO CAPS
25.0000 mg | ORAL_CAPSULE | Freq: Once | ORAL | Status: AC
  Administered 2023-01-27: 25 mg via ORAL
  Filled 2023-01-27: qty 1

## 2023-01-27 MED ORDER — SODIUM CHLORIDE 0.9 % IV SOLN: INTRAVENOUS | Status: DC | PRN

## 2023-01-27 NOTE — Progress Notes (Signed)
PATIENT CARE CENTER NOTE  Diagnosis: Anemia affecting pregnancy in third trimester   Provider: Corlis Hove, NP   Procedure: Venofer 500 mg (#1 of 2)  Note: Patient received Venofer 500 mg infusion via PIV. Pt had about 4 minutes left in infusion, when she called out to RN to report that she felt a tingling, burning, and itching sensation in legs. Pt reports also feeling this is arms. Infusion stopped, and vital signs obtained. Vital signs stable. Pt has PRN medications ordered in case of a reaction, but patient refused anything by IV. RN called [employer] for [employer]. Per Dr. Mariel Aloe, pt can get the PRN IV medications, or she could get PO Benadryl 25 mg. Pt agreed that she would take Benadryl PO. Pt observed for 45 minutes after getting Benadryl and Vital signs remain stable. Pt advised if symptoms get worse, or pt develops any new symptoms that she should seek medical care, pt verbalized understanding. Pt advised to follow up with ordering provider about receiving second dose. Pt is alert, oriented, and ambulatory at discharge. Pt discharged home with friend.

## 2023-01-30 ENCOUNTER — Other Ambulatory Visit: Payer: Self-pay | Admitting: Student

## 2023-01-30 DIAGNOSIS — O99019 Anemia complicating pregnancy, unspecified trimester: Secondary | ICD-10-CM

## 2023-02-01 ENCOUNTER — Ambulatory Visit (INDEPENDENT_AMBULATORY_CARE_PROVIDER_SITE_OTHER): Payer: Self-pay | Admitting: Pediatrics

## 2023-02-01 DIAGNOSIS — Z7681 Expectant parent(s) prebirth pediatrician visit: Secondary | ICD-10-CM

## 2023-02-02 ENCOUNTER — Other Ambulatory Visit: Payer: Self-pay

## 2023-02-02 ENCOUNTER — Other Ambulatory Visit (HOSPITAL_COMMUNITY)
Admission: RE | Admit: 2023-02-02 | Discharge: 2023-02-02 | Disposition: A | Discharge status: Home / Self Care | Payer: Medicaid Other | Source: Ambulatory Visit | Attending: Obstetrics & Gynecology | Admitting: Obstetrics & Gynecology

## 2023-02-02 ENCOUNTER — Ambulatory Visit (INDEPENDENT_AMBULATORY_CARE_PROVIDER_SITE_OTHER): Payer: Medicaid Other | Admitting: Obstetrics & Gynecology

## 2023-02-02 VITALS — BP 104/69 | HR 98 | Wt 171.0 lb

## 2023-02-02 DIAGNOSIS — Z7681 Expectant parent(s) prebirth pediatrician visit: Secondary | ICD-10-CM | POA: Insufficient documentation

## 2023-02-02 DIAGNOSIS — Z3492 Encounter for supervision of normal pregnancy, unspecified, second trimester: Secondary | ICD-10-CM

## 2023-02-02 DIAGNOSIS — Z3493 Encounter for supervision of normal pregnancy, unspecified, third trimester: Secondary | ICD-10-CM

## 2023-02-02 DIAGNOSIS — Z3A37 37 weeks gestation of pregnancy: Secondary | ICD-10-CM

## 2023-02-02 DIAGNOSIS — D563 Thalassemia minor: Secondary | ICD-10-CM

## 2023-02-02 NOTE — Progress Notes (Signed)
Prenatal counseling for impending newborn done-- Z76.81  

## 2023-02-02 NOTE — Progress Notes (Signed)
Pt reports Allergic reaction when she was getting Iron infusion.

## 2023-02-02 NOTE — Progress Notes (Signed)
   PRENATAL VISIT NOTE  Subjective:  Alexis Gray is a 22 y.o. G1P0 at [redacted]w[redacted]d being seen today for ongoing prenatal care.  She is currently monitored for the following issues for this low-risk pregnancy and has Supervision of low-risk pregnancy; Alpha thalassemia silent carrier; [redacted] weeks gestation of pregnancy; and Anemia affecting pregnancy on their problem list.  Patient reports no complaints.  Contractions: Irritability. Vag. Bleeding: None.  Movement: Present. Denies leaking of fluid.   The following portions of the patient's history were reviewed and updated as appropriate: allergies, current medications, past family history, past medical history, past social history, past surgical history and problem list.   Objective:   Vitals:   02/02/23 1422  BP: 104/69  Pulse: 98  Weight: 171 lb (77.6 kg)    Fetal Status: Fetal Heart Rate (bpm): 150   Movement: Present     General:  Alert, oriented and cooperative. Patient is in no acute distress.  Skin: Skin is warm and dry. No rash noted.   Cardiovascular: Normal heart rate noted  Respiratory: Normal respiratory effort, no problems with respiration noted  Abdomen: Soft, gravid, appropriate for gestational age.  Pain/Pressure: Present     Pelvic: Cervical exam deferred        Extremities: Normal range of motion.  Edema: Trace  Mental Status: Normal mood and affect. Normal behavior. Normal judgment and thought content.   Assessment and Plan:  Pregnancy: G1P0 at [redacted]w[redacted]d 1. Alpha thalassemia silent carrier   2. Encounter for supervision of low-risk pregnancy in second trimester  - GC/Chlamydia probe amp ([place])not at [facility] - Culture, beta strep (group b only)  Term labor symptoms and general obstetric precautions including but not limited to vaginal bleeding, contractions, leaking of fluid and fetal movement were reviewed in detail with the patient. Please refer to After Visit Summary for other counseling recommendations.   Return  in about 1 week (around 02/09/2023).  Future Appointments  Date Time Provider Department Center  02/09/2023  9:15 AM Venora Maples, MD [facility]    Scheryl Darter, MD

## 2023-02-03 LAB — GC/CHLAMYDIA PROBE AMP (~~LOC~~) NOT AT ARMC
Chlamydia: NEGATIVE
Comment: NEGATIVE
Comment: NORMAL
Neisseria Gonorrhea: NEGATIVE

## 2023-02-05 LAB — CULTURE, BETA STREP (GROUP B ONLY): Strep Gp B Culture: POSITIVE — AB

## 2023-02-06 ENCOUNTER — Other Ambulatory Visit: Payer: Self-pay

## 2023-02-06 ENCOUNTER — Inpatient Hospital Stay (HOSPITAL_COMMUNITY)
Admission: AD | Admit: 2023-02-06 | Discharge: 2023-02-06 | Disposition: A | Discharge status: Home / Self Care | Payer: Medicaid Other | Attending: Obstetrics and Gynecology | Admitting: Obstetrics and Gynecology

## 2023-02-06 ENCOUNTER — Encounter (HOSPITAL_COMMUNITY): Payer: Self-pay | Admitting: Obstetrics and Gynecology

## 2023-02-06 DIAGNOSIS — O99013 Anemia complicating pregnancy, third trimester: Secondary | ICD-10-CM

## 2023-02-06 DIAGNOSIS — Z3A32 32 weeks gestation of pregnancy: Secondary | ICD-10-CM

## 2023-02-06 DIAGNOSIS — Z3A37 37 weeks gestation of pregnancy: Secondary | ICD-10-CM | POA: Diagnosis not present

## 2023-02-06 DIAGNOSIS — D563 Thalassemia minor: Secondary | ICD-10-CM | POA: Insufficient documentation

## 2023-02-06 DIAGNOSIS — Z3493 Encounter for supervision of normal pregnancy, unspecified, third trimester: Secondary | ICD-10-CM | POA: Diagnosis not present

## 2023-02-06 DIAGNOSIS — Z3492 Encounter for supervision of normal pregnancy, unspecified, second trimester: Secondary | ICD-10-CM

## 2023-02-06 NOTE — OB Triage Provider Note (Signed)
I have communicated with Rodena Piety, cnm and reviewed vital signs:  Vitals:   02/06/23 1937 02/06/23 2041  BP: 100/60 111/62  Pulse: 93 83  Resp: 16   Temp: (!) 97.4 F (36.3 C)     Vaginal exam:  Dilation: 1.5 Effacement (%): 80 Station: -2 Presentation: Vertex Exam by:: Karl Ito, rnc,   Also reviewed contraction pattern and that non-stress test is reactive.  It has been documented that patient is contracting occasionally not indicating labor.  Patient denies any other complaints.  Based on this report provider has given order for discharge.  A discharge order and diagnosis entered by a provider.   Labor discharge instructions reviewed with patient.

## 2023-02-06 NOTE — MAU Note (Addendum)
Pt says UC strong since 330pm [facility]- clinic Denies HSV GBS- positive No VE in office

## 2023-02-09 ENCOUNTER — Ambulatory Visit (INDEPENDENT_AMBULATORY_CARE_PROVIDER_SITE_OTHER): Payer: Medicaid Other | Admitting: Family Medicine

## 2023-02-09 ENCOUNTER — Encounter: Payer: Self-pay | Admitting: Family Medicine

## 2023-02-09 ENCOUNTER — Other Ambulatory Visit: Payer: Self-pay

## 2023-02-09 VITALS — BP 107/71 | HR 94 | Wt 174.6 lb

## 2023-02-09 DIAGNOSIS — Z3493 Encounter for supervision of normal pregnancy, unspecified, third trimester: Secondary | ICD-10-CM

## 2023-02-09 DIAGNOSIS — O99820 Streptococcus B carrier state complicating pregnancy: Secondary | ICD-10-CM

## 2023-02-09 DIAGNOSIS — Z3A38 38 weeks gestation of pregnancy: Secondary | ICD-10-CM

## 2023-02-09 DIAGNOSIS — O99013 Anemia complicating pregnancy, third trimester: Secondary | ICD-10-CM

## 2023-02-09 NOTE — Patient Instructions (Signed)

## 2023-02-09 NOTE — Progress Notes (Signed)
   Subjective:  Alexis Gray is a 22 y.o. G1P0 at [redacted]w[redacted]d being seen today for ongoing prenatal care.  She is currently monitored for the following issues for this low-risk pregnancy and has Supervision of low-risk pregnancy; Alpha thalassemia silent carrier; [redacted] weeks gestation of pregnancy; Anemia affecting pregnancy; Pediatric pre-birth visit for expectant parent; and GBS (group B Streptococcus carrier), +RV culture, currently pregnant on their problem list.  Patient reports no complaints.  Contractions: Irritability. Vag. Bleeding: None.  Movement: Present. Denies leaking of fluid.   The following portions of the patient's history were reviewed and updated as appropriate: allergies, current medications, past family history, past medical history, past social history, past surgical history and problem list. Problem list updated.  Objective:   Vitals:   02/09/23 0911  BP: 107/71  Pulse: 94  Weight: 174 lb 9.6 oz (79.2 kg)    Fetal Status: Fetal Heart Rate (bpm): 137 Fundal Height: 38 cm Movement: Present     General:  Alert, oriented and cooperative. Patient is in no acute distress.  Skin: Skin is warm and dry. No rash noted.   Cardiovascular: Normal heart rate noted  Respiratory: Normal respiratory effort, no problems with respiration noted  Abdomen: Soft, gravid, appropriate for gestational age. Pain/Pressure: Present     Pelvic: Vag. Bleeding: None     Cervical exam deferred        Extremities: Normal range of motion.  Edema: None  Mental Status: Normal mood and affect. Normal behavior. Normal judgment and thought content.   Urinalysis:      Assessment and Plan:  Pregnancy: G1P0 at [redacted]w[redacted]d  1. Encounter for supervision of low-risk pregnancy in third trimester BP and FHR normal FH appropriate Strongly desires 39 week IOL, discussed this would be elective and she is not guaranteed to get in before 41 weeks Orders placed, form faxed  2. GBS (group B Streptococcus carrier), +RV  culture, currently pregnant Discussed ppx in labor  3. Anemia affecting pregnancy in third trimester Lab Results  Component Value Date   HGB 9.5 (L) 01/27/2023   S/p IV iron x2  Term labor symptoms and general obstetric precautions including but not limited to vaginal bleeding, contractions, leaking of fluid and fetal movement were reviewed in detail with the patient. Please refer to After Visit Summary for other counseling recommendations.  Return in 1 week (on 02/16/2023) for [facility], ob visit.   Venora Maples, MD

## 2023-02-15 ENCOUNTER — Other Ambulatory Visit: Payer: Self-pay

## 2023-02-15 ENCOUNTER — Ambulatory Visit (INDEPENDENT_AMBULATORY_CARE_PROVIDER_SITE_OTHER): Payer: Medicaid Other | Admitting: Obstetrics and Gynecology

## 2023-02-15 VITALS — BP 113/73 | HR 104 | Wt 175.0 lb

## 2023-02-15 DIAGNOSIS — O99820 Streptococcus B carrier state complicating pregnancy: Secondary | ICD-10-CM

## 2023-02-15 DIAGNOSIS — Z3A39 39 weeks gestation of pregnancy: Secondary | ICD-10-CM

## 2023-02-15 DIAGNOSIS — D563 Thalassemia minor: Secondary | ICD-10-CM

## 2023-02-15 DIAGNOSIS — Z3493 Encounter for supervision of normal pregnancy, unspecified, third trimester: Secondary | ICD-10-CM

## 2023-02-15 NOTE — Progress Notes (Signed)
   PRENATAL VISIT NOTE  Subjective:  Alexis Gray is a 22 y.o. G1P0 at [redacted]w[redacted]d being seen today for ongoing prenatal care.  She is currently monitored for the following issues for this low-risk pregnancy and has Supervision of low-risk pregnancy; Alpha thalassemia silent carrier; [redacted] weeks gestation of pregnancy; Anemia affecting pregnancy; Pediatric pre-birth visit for expectant parent; and GBS (group B Streptococcus carrier), +RV culture, currently pregnant on their problem list.  Patient doing well with no acute concerns today. She reports backache and generalized pregnancy discomfort .  Contractions: Regular.  .  Movement: Present (movement not as much as before every 3-4 hours). Denies leaking of fluid.   The following portions of the patient's history were reviewed and updated as appropriate: allergies, current medications, past family history, past medical history, past social history, past surgical history and problem list. Problem list updated.  Objective:   Vitals:   02/15/23 0937  BP: 113/73  Pulse: (!) 104  Weight: 175 lb (79.4 kg)    Fetal Status: Fetal Heart Rate (bpm): 147 Fundal Height: 38 cm Movement: Present (movement not as much as before every 3-4 hours)     General:  Alert, oriented and cooperative. Patient is in no acute distress.  Skin: Skin is warm and dry. No rash noted.   Cardiovascular: Normal heart rate noted  Respiratory: Normal respiratory effort, no problems with respiration noted  Abdomen: Soft, gravid, appropriate for gestational age.  Pain/Pressure: Absent     Pelvic: Cervical exam performed Dilation: 1 Effacement (%): 60 Station: -2  Extremities: Normal range of motion.  Edema: Mild pitting, slight indentation  Mental Status:  Normal mood and affect. Normal behavior. Normal judgment and thought content.   Assessment and Plan:  Pregnancy: G1P0 at [redacted]w[redacted]d  1. [redacted] weeks gestation of pregnancy   2. GBS (group B Streptococcus carrier), +RV culture,  currently pregnant Treat in labor  3. Alpha thalassemia silent carrier   4. Encounter for supervision of low-risk pregnancy in third trimester Continue routine prenatal care IOL scheduled for 03/01/23 NST at next visit  Term labor symptoms and general obstetric precautions including but not limited to vaginal bleeding, contractions, leaking of fluid and fetal movement were reviewed in detail with the patient.  Please refer to After Visit Summary for other counseling recommendations.   Return in about 1 week (around 02/22/2023) for ROB, in person.   Mariel Aloe, MD Faculty Attending Center for [facility]

## 2023-02-16 ENCOUNTER — Encounter (HOSPITAL_COMMUNITY): Payer: Self-pay | Admitting: Family Medicine

## 2023-02-16 ENCOUNTER — Inpatient Hospital Stay (HOSPITAL_COMMUNITY)
Admission: AD | Admit: 2023-02-16 | Discharge: 2023-02-19 | DRG: 807 | Disposition: A | Discharge status: Home / Self Care | Payer: Medicaid Other | Attending: Obstetrics and Gynecology | Admitting: Obstetrics and Gynecology

## 2023-02-16 ENCOUNTER — Other Ambulatory Visit: Payer: Self-pay

## 2023-02-16 DIAGNOSIS — O9902 Anemia complicating childbirth: Principal | ICD-10-CM | POA: Diagnosis present

## 2023-02-16 DIAGNOSIS — Z3A39 39 weeks gestation of pregnancy: Secondary | ICD-10-CM

## 2023-02-16 DIAGNOSIS — O99820 Streptococcus B carrier state complicating pregnancy: Secondary | ICD-10-CM | POA: Diagnosis not present

## 2023-02-16 DIAGNOSIS — Z148 Genetic carrier of other disease: Secondary | ICD-10-CM | POA: Diagnosis not present

## 2023-02-16 DIAGNOSIS — O99824 Streptococcus B carrier state complicating childbirth: Secondary | ICD-10-CM | POA: Diagnosis present

## 2023-02-16 DIAGNOSIS — O26893 Other specified pregnancy related conditions, third trimester: Secondary | ICD-10-CM | POA: Diagnosis present

## 2023-02-16 DIAGNOSIS — Z3490 Encounter for supervision of normal pregnancy, unspecified, unspecified trimester: Secondary | ICD-10-CM

## 2023-02-16 DIAGNOSIS — O99019 Anemia complicating pregnancy, unspecified trimester: Secondary | ICD-10-CM | POA: Diagnosis present

## 2023-02-16 DIAGNOSIS — Z3493 Encounter for supervision of normal pregnancy, unspecified, third trimester: Secondary | ICD-10-CM

## 2023-02-16 LAB — CBC
HCT: 32.3 % — ABNORMAL LOW (ref 36.0–46.0)
Hemoglobin: 10 g/dL — ABNORMAL LOW (ref 12.0–15.0)
MCH: 25.4 pg — ABNORMAL LOW (ref 26.0–34.0)
MCHC: 31 g/dL (ref 30.0–36.0)
MCV: 82.2 fL (ref 80.0–100.0)
Platelets: 405 10*3/uL — ABNORMAL HIGH (ref 150–400)
RBC: 3.93 MIL/uL (ref 3.87–5.11)
RDW: 21.3 % — ABNORMAL HIGH (ref 11.5–15.5)
WBC: 8.2 10*3/uL (ref 4.0–10.5)
nRBC: 0 % (ref 0.0–0.2)

## 2023-02-16 LAB — TYPE AND SCREEN
ABO/RH(D): B POS
Antibody Screen: NEGATIVE

## 2023-02-16 MED ORDER — MISOPROSTOL 25 MCG QUARTER TABLET
25.0000 ug | ORAL_TABLET | Freq: Once | ORAL | Status: DC
  Filled 2023-02-16: qty 1

## 2023-02-16 MED ORDER — MISOPROSTOL 25 MCG QUARTER TABLET
25.0000 ug | ORAL_TABLET | ORAL | Status: DC | PRN
  Administered 2023-02-16: 25 ug via VAGINAL

## 2023-02-16 MED ORDER — MISOPROSTOL 50MCG HALF TABLET
50.0000 ug | ORAL_TABLET | Freq: Once | ORAL | Status: AC
  Administered 2023-02-16: 50 ug via ORAL
  Filled 2023-02-16: qty 1

## 2023-02-16 MED ORDER — OXYTOCIN BOLUS FROM INFUSION
333.0000 mL | Freq: Once | INTRAVENOUS | Status: AC
  Administered 2023-02-17: 333 mL via INTRAVENOUS

## 2023-02-16 MED ORDER — PENICILLIN G POT IN DEXTROSE 60000 UNIT/ML IV SOLN
3.0000 10*6.[IU] | INTRAVENOUS | Status: DC
  Administered 2023-02-17: 3 10*6.[IU] via INTRAVENOUS
  Filled 2023-02-16: qty 50

## 2023-02-16 MED ORDER — FENTANYL CITRATE (PF) 100 MCG/2ML IJ SOLN
50.0000 ug | INTRAMUSCULAR | Status: DC | PRN
  Administered 2023-02-16 – 2023-02-17 (×5): 100 ug via INTRAVENOUS
  Filled 2023-02-16 (×5): qty 2

## 2023-02-16 MED ORDER — OXYTOCIN-SODIUM CHLORIDE 30-0.9 UT/500ML-% IV SOLN
2.5000 [IU]/h | INTRAVENOUS | Status: DC
  Administered 2023-02-17: 2.5 [IU]/h via INTRAVENOUS

## 2023-02-16 MED ORDER — SODIUM CHLORIDE 0.9 % IV SOLN
5.0000 10*6.[IU] | Freq: Once | INTRAVENOUS | Status: AC
  Administered 2023-02-16: 5 10*6.[IU] via INTRAVENOUS
  Filled 2023-02-16: qty 5

## 2023-02-16 MED ORDER — LACTATED RINGERS IV SOLN: 500.0000 mL | INTRAVENOUS | Status: DC | PRN

## 2023-02-16 MED ORDER — OXYTOCIN-SODIUM CHLORIDE 30-0.9 UT/500ML-% IV SOLN
1.0000 m[IU]/min | INTRAVENOUS | Status: DC
  Administered 2023-02-17: 2 m[IU]/min via INTRAVENOUS
  Filled 2023-02-16: qty 500

## 2023-02-16 MED ORDER — LACTATED RINGERS IV SOLN: INTRAVENOUS | Status: DC

## 2023-02-16 MED ORDER — ACETAMINOPHEN 325 MG PO TABS: 650.0000 mg | ORAL_TABLET | ORAL | Status: DC | PRN

## 2023-02-16 MED ORDER — SOD CITRATE-CITRIC ACID 500-334 MG/5ML PO SOLN: 30.0000 mL | ORAL | Status: DC | PRN

## 2023-02-16 MED ORDER — LIDOCAINE HCL (PF) 1 % IJ SOLN: 30.0000 mL | INTRAMUSCULAR | Status: DC | PRN

## 2023-02-16 MED ORDER — TERBUTALINE SULFATE 1 MG/ML IJ SOLN: 0.2500 mg | Freq: Once | INTRAMUSCULAR | Status: DC | PRN

## 2023-02-16 MED ORDER — ONDANSETRON HCL 4 MG/2ML IJ SOLN: 4.0000 mg | Freq: Four times a day (QID) | INTRAMUSCULAR | Status: DC | PRN

## 2023-02-16 NOTE — H&P (Signed)
OBSTETRIC ADMISSION HISTORY AND PHYSICAL  Alexis Gray is a 22 y.o. female G1P0 with IUP at [redacted]w[redacted]d by 9 week [place] presenting for scheduled elective IOL. She reports +FMs, No LOF, no VB, no blurry vision, headaches or peripheral edema, and RUQ pain.  She plans on breast feeding. She request an IUD for birth control. She received her prenatal care at [facility]   Dating: By 9 week [place] --->  Estimated Date of Delivery: 02/22/23  Sono:    @[redacted]w[redacted]d , CWD, normal anatomy, cephalic presentation, 262g, 38% EFW  Prenatal History/Complications:  Anemia of pregnancy, s/p venofer X 2 Alpha thalassemia, silent carrier       Nursing Staff Provider  Office Location [facility] Dating  02/22/2023, by Ultrasound  [facility] Model [x]  Traditional [ ]  Centering [ ]  Mom-Baby Dyad Anatomy US  normal  Language  English      Flu Vaccine  Declined 08/17/22 Genetic/Carrier Screen  NIPS:  low risk   AFP:   normal Horizon: alpha-thalassemia carrier   TDaP Vaccine  01/21/2023 Hgb A1C or  GTT Early - normal Third trimester: normal  COVID Vaccine     LAB RESULTS   Rhogam  B/Positive/-- (01/05 1113)  Blood Type B/Positive/-- (01/05 1113)   Baby Feeding Plan Breast  Antibody Negative (01/05 1113)  Contraception IUD Rubella 12.60 (01/05 1113)  Circumcision Yes RPR Non Reactive (04/01 0855)   Pediatrician  List Given HBsAg Negative (01/05 1113)   Support Person   HCVAb Non Reactive (01/05 1113)   Prenatal Classes   HIV Non Reactive (04/01 0855)     BTL Consent NA GBS Positive/-- (06/06 1707)   VBAC Consent NA Pap No results found for: "DIAGPAP"           DME Rx [ ]  BP cuff [ ]  Weight Scale Waterbirth  [ ]  Class [ ]  Consent [ ]  CNM visit  PHQ9 & GAD7 [  ] new OB [x]  28 weeks  [  ] 36 weeks Induction  [ ]  Orders Entered [ ] Foley Y/N     Past Medical History: Past Medical History:  Diagnosis Date   Medical history non-contributory     Past Surgical History: Past Surgical History:  Procedure Laterality Date    windsom tooth      Obstetrical History: OB History     Gravida  1   Para      Term      Preterm      AB      Living         SAB      IAB      Ectopic      Multiple      Live Births              Social History Social History   Socioeconomic History   Marital status: Single    Spouse name: Not on file   Number of children: Not on file   Years of education: Not on file   Highest education level: Not on file  Occupational History   Not on file  Tobacco Use   Smoking status: Never   Smokeless tobacco: Never  Vaping Use   Vaping Use: Never used  Substance and Sexual Activity   Alcohol use: No   Drug use: No   Sexual activity: Yes    Birth control/protection: None  Other Topics Concern   Not on file  Social History Narrative   Not on file  Social Determinants of Health   Financial Resource Strain: Not on file  Food Insecurity: No Food Insecurity (02/16/2023)   Hunger Vital Sign    Worried About Running Out of Food in the Last Year: Never true    Ran Out of Food in the Last Year: Never true  Transportation Needs: No Transportation Needs (02/16/2023)   PRAPARE - Administrator, Civil Service (Medical): No    Lack of Transportation (Non-Medical): No  Physical Activity: Not on file  Stress: Not on file  Social Connections: Not on file    Family History: Family History  Problem Relation Age of Onset   Healthy Mother    Healthy Father     Allergies: Allergies  Allergen Reactions   Venofer [Iron Sucrose] Itching and Other (See Comments)    Burning/tingling of arms and legs    Medications Prior to Admission  Medication Sig Dispense Refill Last Dose   ferrous sulfate 325 (65 FE) MG EC tablet Take 1 tablet (325 mg total) by mouth every other day. (Patient not taking: Reported on 01/02/2023) 45 tablet 1    prenatal vitamin w/FE, FA (PRENATAL 1 + 1) 27-1 MG TABS tablet Take 1 tablet by mouth daily at 12 noon. 30 tablet 11       Review of Systems   All systems reviewed and negative except as stated in HPI  Blood pressure 113/66, pulse (!) 107, temperature 98.1 F (36.7 C), temperature source Oral, resp. rate 18, last menstrual period 04/29/2022.  General appearance: alert, cooperative, and appears stated age Lungs: clear to auscultation bilaterally Heart: regular rate and rhythm Abdomen: soft, non-tender; bowel sounds normal Pelvic: see below Extremities: Homans sign is negative, no sign of DVT  Presentation: cephalic Fetal monitoring: 150 bpm, moderate variability, + accelerations, no decelerations  Uterine activity: occasional mild contractions.  Dilation: 1 Effacement (%): 60 Station: -3 Exam by:: Ara Kussmaul, RNC  Prenatal labs: ABO, Rh: --/--/B POS (06/20 1639) Antibody: NEG (06/20 1639) Rubella: 12.60 (01/05 1113) RPR: Non Reactive (04/01 0855)  HBsAg: Negative (01/05 1113)  HIV: Non Reactive (04/01 0855)  GBS: Positive/-- (06/06 1707)  2 hr Glucola normal Genetic screening  low risk Anatomy US normal  Prenatal Transfer Tool  Maternal Diabetes: No Genetic Screening: Normal Maternal Ultrasounds/Referrals: Normal Fetal Ultrasounds or other Referrals:  None Maternal Substance Abuse:  No Significant Maternal Medications:  None Significant Maternal Lab Results:  Group B Strep negative Number of Prenatal Visits:greater than 3 verified prenatal visits Other Comments:  None  Results for orders placed or performed during the hospital encounter of 02/16/23 (from the past 24 hour(s))  Type and screen   Collection Time: 02/16/23  4:39 PM  Result Value Ref Range   ABO/RH(D) B POS    Antibody Screen NEG    Sample Expiration      02/19/2023,2359 Performed at [facility], [address] [place], [place] [address]   CBC   Collection Time: 02/16/23  4:43 PM  Result Value Ref Range   WBC 8.2 4.0 - 10.5 K/uL   RBC 3.93 3.87 - 5.11 MIL/uL   Hemoglobin 10.0 (L) 12.0 - 15.0 g/dL    HCT 40.9 (L) 81.1 - 46.0 %   MCV 82.2 80.0 - 100.0 fL   MCH 25.4 (L) 26.0 - 34.0 pg   MCHC 31.0 30.0 - 36.0 g/dL   RDW 91.4 (H) 78.2 - 95.6 %   Platelets 405 (H) 150 - 400 K/uL   nRBC  0.0 0.0 - 0.2 %    Patient Active Problem List   Diagnosis Date Noted   Encounter for elective induction of labor 02/16/2023   GBS (group B Streptococcus carrier), +RV culture, currently pregnant 02/09/2023   Pediatric pre-birth visit for expectant parent 02/02/2023   [redacted] weeks gestation of pregnancy 01/02/2023   Anemia affecting pregnancy 01/02/2023   Alpha thalassemia silent carrier 11/28/2022   Supervision of low-risk pregnancy 08/17/2022    Assessment/Plan:  [place] L Dix is a 22 y.o. G1P0 at [redacted]w[redacted]d here for scheduled elective IOL. Pregnancy complicated only by anemia of pregnancy.  #Labor: admit to L&D. Cytotec 50 PO/ 25 vaginal. FB inserted with patient's consent. Inflated with 60 cc. #Pain: Family support, IV fentanyl, epidural when ready  #FWB: Cat 1 #ID:  GBS neg #MOF: breast #MOC: IUD #Circ:  yes  Sheppard Evens MD MPH OB Fellow, Faculty Practice [facility], Center for [facility] 02/16/2023

## 2023-02-17 ENCOUNTER — Inpatient Hospital Stay (HOSPITAL_COMMUNITY): Payer: Medicaid Other | Admitting: Anesthesiology

## 2023-02-17 ENCOUNTER — Encounter (HOSPITAL_COMMUNITY): Payer: Self-pay | Admitting: Family Medicine

## 2023-02-17 DIAGNOSIS — Z3A39 39 weeks gestation of pregnancy: Secondary | ICD-10-CM

## 2023-02-17 DIAGNOSIS — O99820 Streptococcus B carrier state complicating pregnancy: Secondary | ICD-10-CM

## 2023-02-17 LAB — RPR: RPR Ser Ql: NONREACTIVE

## 2023-02-17 MED ORDER — WITCH HAZEL-GLYCERIN EX PADS: 1.0000 | MEDICATED_PAD | CUTANEOUS | Status: DC | PRN

## 2023-02-17 MED ORDER — COCONUT OIL OIL: 1.0000 | TOPICAL_OIL | Status: DC | PRN

## 2023-02-17 MED ORDER — SODIUM CHLORIDE 0.9% FLUSH: 3.0000 mL | INTRAVENOUS | Status: DC | PRN

## 2023-02-17 MED ORDER — PHENYLEPHRINE 80 MCG/ML (10ML) SYRINGE FOR IV PUSH (FOR BLOOD PRESSURE SUPPORT): 80.0000 ug | PREFILLED_SYRINGE | INTRAVENOUS | Status: DC | PRN

## 2023-02-17 MED ORDER — TRANEXAMIC ACID-NACL 1000-0.7 MG/100ML-% IV SOLN
INTRAVENOUS | Status: AC
  Administered 2023-02-17: 1000 mg
  Filled 2023-02-17: qty 100

## 2023-02-17 MED ORDER — ONDANSETRON HCL 4 MG/2ML IJ SOLN: 4.0000 mg | INTRAMUSCULAR | Status: DC | PRN

## 2023-02-17 MED ORDER — DIBUCAINE (PERIANAL) 1 % EX OINT: 1.0000 | TOPICAL_OINTMENT | CUTANEOUS | Status: DC | PRN

## 2023-02-17 MED ORDER — ZOLPIDEM TARTRATE 5 MG PO TABS: 5.0000 mg | ORAL_TABLET | Freq: Every evening | ORAL | Status: DC | PRN

## 2023-02-17 MED ORDER — ACETAMINOPHEN 325 MG PO TABS
650.0000 mg | ORAL_TABLET | ORAL | Status: DC | PRN
  Administered 2023-02-17 – 2023-02-18 (×2): 650 mg via ORAL
  Filled 2023-02-17 (×2): qty 2

## 2023-02-17 MED ORDER — BENZOCAINE-MENTHOL 20-0.5 % EX AERO
1.0000 | INHALATION_SPRAY | CUTANEOUS | Status: DC | PRN
  Administered 2023-02-17: 1 via TOPICAL
  Filled 2023-02-17: qty 56

## 2023-02-17 MED ORDER — IBUPROFEN 600 MG PO TABS
600.0000 mg | ORAL_TABLET | Freq: Four times a day (QID) | ORAL | Status: DC
  Administered 2023-02-17 – 2023-02-19 (×8): 600 mg via ORAL
  Filled 2023-02-17 (×9): qty 1

## 2023-02-17 MED ORDER — SENNOSIDES-DOCUSATE SODIUM 8.6-50 MG PO TABS
2.0000 | ORAL_TABLET | ORAL | Status: DC
  Administered 2023-02-17: 2 via ORAL
  Filled 2023-02-17 (×2): qty 2

## 2023-02-17 MED ORDER — EPHEDRINE 5 MG/ML INJ: 10.0000 mg | INTRAVENOUS | Status: DC | PRN

## 2023-02-17 MED ORDER — DIPHENHYDRAMINE HCL 50 MG/ML IJ SOLN: 12.5000 mg | INTRAMUSCULAR | Status: DC | PRN

## 2023-02-17 MED ORDER — PRENATAL MULTIVITAMIN CH
1.0000 | ORAL_TABLET | Freq: Every day | ORAL | Status: DC
  Administered 2023-02-18 – 2023-02-19 (×2): 1 via ORAL
  Filled 2023-02-17 (×2): qty 1

## 2023-02-17 MED ORDER — SODIUM CHLORIDE 0.9 % IV SOLN: INTRAVENOUS | Status: DC | PRN

## 2023-02-17 MED ORDER — FERROUS SULFATE 325 (65 FE) MG PO TABS
325.0000 mg | ORAL_TABLET | ORAL | Status: DC
  Filled 2023-02-17: qty 1

## 2023-02-17 MED ORDER — DIPHENHYDRAMINE HCL 25 MG PO CAPS: 25.0000 mg | ORAL_CAPSULE | Freq: Four times a day (QID) | ORAL | Status: DC | PRN

## 2023-02-17 MED ORDER — FENTANYL-BUPIVACAINE-NACL 0.5-0.125-0.9 MG/250ML-% EP SOLN
12.0000 mL/h | EPIDURAL | Status: DC | PRN
  Administered 2023-02-17: 12 mL/h via EPIDURAL
  Filled 2023-02-17: qty 250

## 2023-02-17 MED ORDER — SIMETHICONE 80 MG PO CHEW: 80.0000 mg | CHEWABLE_TABLET | ORAL | Status: DC | PRN

## 2023-02-17 MED ORDER — SODIUM CHLORIDE 0.9% FLUSH
3.0000 mL | Freq: Two times a day (BID) | INTRAVENOUS | Status: DC
  Administered 2023-02-17 – 2023-02-18 (×2): 3 mL via INTRAVENOUS

## 2023-02-17 MED ORDER — OXYCODONE HCL 5 MG PO TABS: 5.0000 mg | ORAL_TABLET | ORAL | Status: DC | PRN

## 2023-02-17 MED ORDER — LIDOCAINE HCL (PF) 1 % IJ SOLN
INTRAMUSCULAR | Status: DC | PRN
  Administered 2023-02-17: 8 mL via EPIDURAL

## 2023-02-17 MED ORDER — LACTATED RINGERS IV SOLN
500.0000 mL | Freq: Once | INTRAVENOUS | Status: AC
  Administered 2023-02-17: 500 mL via INTRAVENOUS

## 2023-02-17 MED ORDER — ONDANSETRON HCL 4 MG PO TABS: 4.0000 mg | ORAL_TABLET | ORAL | Status: DC | PRN

## 2023-02-17 NOTE — Discharge Summary (Signed)
Postpartum Discharge Summary  Date of Service updated***     Patient Name: Alexis Gray DOB: 29-Jul-2001 MRN: [id number]  Date of admission: 02/16/2023 Delivery date:02/17/2023  Delivering provider: Alfredia Ferguson  Date of discharge: 02/17/2023  Admitting diagnosis: Encounter for elective induction of labor [Z34.90] Intrauterine pregnancy: [redacted]w[redacted]d     Secondary diagnosis:  Principal Problem:   Vaginal delivery Active Problems:   Supervision of low-risk pregnancy   Anemia affecting pregnancy   GBS (group B Streptococcus carrier), +RV culture, currently pregnant  Additional problems: ***    Discharge diagnosis: Term Pregnancy Delivered, Anemia, and ***                                              Post partum procedures:{Postpartum procedures:23558} Augmentation: Pitocin, Cytotec, and IP Foley Complications: None  Hospital course: Induction of Labor With Vaginal Delivery   22 y.o. yo G1P1001 at [redacted]w[redacted]d was admitted to the hospital 02/16/2023 for induction of labor.  Indication for induction: Elective.  Patient had an labor course complicated by: none. Membrane Rupture Time/Date: 11:48 AM ,02/17/2023   Delivery Method:Vaginal, Spontaneous  Episiotomy: None  Lacerations:  2nd degree;Perineal  Details of delivery can be found in separate delivery note.  Patient had a postpartum course complicated by***. Patient is discharged home 02/17/23.  Newborn Data: Birth date:02/17/2023  Birth time:1:09 PM  Gender:Female  Living status:Living  Apgars:8 ,9  Weight:3010 g   Magnesium Sulfate received: No BMZ received: No Rhophylac:N/A MMR:N/A T-DaP:Given prenatally Flu: N/A Transfusion:{Transfusion received:30440034}  Physical exam  Vitals:   02/17/23 1346 02/17/23 1400 02/17/23 1415 02/17/23 1430  BP: 104/60 97/73 111/83 108/70  Pulse: 97 96 92 96  Resp:      Temp:    (!) 97.4 F (36.3 C)  TempSrc:    Oral  SpO2:      Weight:      Height:       General: {Exam;  general:21111117} Lochia: {Desc; appropriate/inappropriate:30686::"appropriate"} Uterine Fundus: {Desc; firm/soft:30687} Incision: {Exam; incision:21111123} DVT Evaluation: {Exam; dvt:2111122} Labs: Lab Results  Component Value Date   WBC 8.2 02/16/2023   HGB 10.0 (L) 02/16/2023   HCT 32.3 (L) 02/16/2023   MCV 82.2 02/16/2023   PLT 405 (H) 02/16/2023      Latest Ref Rng & Units 01/19/2023   12:27 PM  CMP  Glucose 70 - 99 mg/dL 72   BUN 6 - 20 mg/dL 5   Creatinine 7.82 - 9.56 mg/dL 2.13   Sodium 086 - 578 mmol/L 134   Potassium 3.5 - 5.2 mmol/L 4.4   Chloride 96 - 106 mmol/L 100   CO2 20 - 29 mmol/L 21   Calcium 8.7 - 10.2 mg/dL 9.2   Total Protein 6.0 - 8.5 g/dL 6.5   Total Bilirubin 0.0 - 1.2 mg/dL <4.6   Alkaline Phos 42 - 106 IU/L 156   AST 0 - 40 IU/L 14   ALT 0 - 32 IU/L 11    Edinburgh Score:     No data to display           After visit meds:  Allergies as of 02/17/2023       Reactions   Venofer [iron Sucrose] Itching, Other (See Comments)   Burning/tingling of arms and legs     Med Rec must be completed prior to using this SMARTLINK***  Discharge home in stable condition Infant Feeding: {Baby feeding:23562} Infant Disposition:{CHL IP OB HOME WITH WGNFAO:13086} Discharge instruction: per After Visit Summary and Postpartum booklet. Activity: Advance as tolerated. Pelvic rest for 6 weeks.  Diet: {OB VHQI:69629528} Future Appointments: Future Appointments  Date Time Provider Department Center  02/23/2023  2:35 PM [place] Bing, MD [facility]  02/23/2023  3:15 PM [facility]   Follow up Visit:  The following message was sent to [facility]  Please schedule this patient for a In person postpartum visit in 4 weeks with the following provider: Any provider. Additional Postpartum F/U: none   Low risk pregnancy complicated by:  anemia Delivery mode:  Vaginal, Spontaneous  Anticipated Birth Control:  IUD   02/17/2023 Alfredia Ferguson, MD

## 2023-02-17 NOTE — Progress Notes (Signed)
Labor Progress Note  Alexis Gray is a 22 y.o. G1P0 at [redacted]w[redacted]d presented for elective induction  S: Patient feeling pressure in her pelvis has required multiple doses of fentanyl thus far  O:  BP 115/69   Pulse 79   Temp 98.2 F (36.8 C) (Oral)   Resp 17   Ht 5\' 5"  (1.651 m)   Wt 80.3 kg   LMP 04/29/2022   BMI 29.45 kg/m  EFM: 135 bpm/Moderate variability/ 15x15 accels/ None decels CAT: 1 Toco: regular, every 1-4 minutes   CVE: Dilation: 4 Effacement (%): 60 Station: -3 Presentation: Vertex Exam by:: Dr. Camelia Phenes   A&P: 22 y.o. G1P0 [redacted]w[redacted]d  here for elective induction as above  #Labor: Progressing well.  Increase Pitocin 2 x 2.  Plan AROM next #Pain: Nitrous oxide, Family/Friend support, and PO/IV pain meds #FWB: CAT 1 #GBS negative   Myrtie Hawk, DO FMOB Fellow, Faculty practice [facility], Center for [facility] 02/17/23  8:03 AM

## 2023-02-17 NOTE — Progress Notes (Signed)
Notified NICU CN of baby's audible heart arrhythmia.

## 2023-02-17 NOTE — Anesthesia Preprocedure Evaluation (Signed)
Anesthesia Evaluation  Patient identified by MRN, date of birth, ID band Patient awake    Reviewed: Allergy & Precautions, H&P , NPO status , Patient's Chart, lab work & pertinent test results, reviewed documented beta blocker date and time   Airway Mallampati: II  TM Distance: >3 FB Neck ROM: full    Dental no notable dental hx. (+) Teeth Intact, Dental Advisory Given   Pulmonary neg pulmonary ROS   Pulmonary exam normal breath sounds clear to auscultation       Cardiovascular negative cardio ROS Normal cardiovascular exam Rhythm:regular Rate:Normal     Neuro/Psych negative neurological ROS  negative psych ROS   GI/Hepatic negative GI ROS, Neg liver ROS,,,  Endo/Other  negative endocrine ROS    Renal/GU negative Renal ROS  negative genitourinary   Musculoskeletal   Abdominal   Peds  Hematology  (+) Blood dyscrasia, Sickle cell anemia and anemia Alpha thalassemia silent carrier   Anesthesia Other Findings   Reproductive/Obstetrics (+) Pregnancy                             Anesthesia Physical Anesthesia Plan  ASA: 2  Anesthesia Plan: Epidural   Post-op Pain Management: Minimal or no pain anticipated   Induction:   PONV Risk Score and Plan: 2  Airway Management Planned: Natural Airway  Additional Equipment: None  Intra-op Plan:   Post-operative Plan:   Informed Consent: I have reviewed the patients History and Physical, chart, labs and discussed the procedure including the risks, benefits and alternatives for the proposed anesthesia with the patient or authorized representative who has indicated his/her understanding and acceptance.       Plan Discussed with: Anesthesiologist and CRNA  Anesthesia Plan Comments:        Anesthesia Quick Evaluation

## 2023-02-17 NOTE — Progress Notes (Signed)
1002 Audible fetal heart arrhythmia noted FHR 70  1004 Audible fetal heart arrhythmia noted FHR 45  1005 Audible fetal heart arrhythmia noted FHR 45  [phone] Audible fetal heart arrhythmia noted FHR 45

## 2023-02-17 NOTE — Anesthesia Postprocedure Evaluation (Signed)
Anesthesia Post Note  Patient: Alexis Gray  Procedure(s) Performed: AN AD HOC LABOR EPIDURAL     Patient location during evaluation: Mother Baby Anesthesia Type: Epidural Level of consciousness: awake and alert Pain management: pain level controlled Vital Signs Assessment: post-procedure vital signs reviewed and stable Respiratory status: spontaneous breathing, nonlabored ventilation and respiratory function stable Cardiovascular status: stable Postop Assessment: no headache, no backache and epidural receding Anesthetic complications: no   No notable events documented.  Last Vitals:  Vitals:   02/17/23 1530 02/17/23 1700  BP: 123/63 104/64  Pulse: 94 93  Resp: 16 18  Temp: (!) 36.4 C 36.8 C  SpO2: 100% 100%    Last Pain:  Vitals:   02/17/23 1700  TempSrc: Oral  PainSc: 0-No pain   Pain Goal:                Epidural/Spinal Function Cutaneous sensation: Able to Wiggle Toes (02/17/23 1700), Patient able to flex knees: Yes (02/17/23 1700), Patient able to lift hips off bed: Yes (02/17/23 1700), Back pain beyond tenderness at insertion site: No (02/17/23 1700), Progressively worsening motor and/or sensory loss: No (02/17/23 1700), Bowel and/or bladder incontinence post epidural: No (02/17/23 1700)  Abdullahi Vallone

## 2023-02-17 NOTE — Anesthesia Procedure Notes (Signed)
Epidural Patient location during procedure: OB Start time: 02/17/2023 9:43 AM End time: 02/17/2023 9:48 AM  Staffing Anesthesiologist: Bethena Midget, MD  Preanesthetic Checklist Completed: patient identified, IV checked, site marked, risks and benefits discussed, surgical consent, monitors and equipment checked, pre-op evaluation and timeout performed  Epidural Patient position: sitting Prep: DuraPrep and site prepped and draped Patient monitoring: continuous pulse ox and blood pressure Approach: midline Location: L3-L4 Injection technique: LOR air  Needle:  Needle type: Tuohy  Needle gauge: 17 G Needle length: 9 cm and 9 Needle insertion depth: 6 cm Catheter type: closed end flexible Catheter size: 19 Gauge Catheter at skin depth: 12 cm Test dose: negative  Assessment Events: blood not aspirated, no cerebrospinal fluid, injection not painful, no injection resistance, no paresthesia and negative IV test

## 2023-02-18 MED ORDER — IBUPROFEN 600 MG PO TABS: 600.0000 mg | ORAL_TABLET | Freq: Four times a day (QID) | ORAL | 0 refills | Status: AC

## 2023-02-18 MED ORDER — FERROUS SULFATE 325 (65 FE) MG PO TABS: 325.0000 mg | ORAL_TABLET | ORAL | 0 refills | Status: AC

## 2023-02-18 MED ORDER — ACETAMINOPHEN 325 MG PO TABS: 650.0000 mg | ORAL_TABLET | ORAL | Status: AC | PRN

## 2023-02-18 NOTE — Lactation Note (Signed)
This note was copied from a baby's chart. Lactation Consultation Note  Patient Name: Alexis Gray [id number] Date: 02/18/2023 Age:21 hours Reason for consult: Initial assessment;Primapara;1st time breastfeeding;Term Per mom baby last fed @8 :15 for 12 mins with swallows.  Dad had changed a large wet and stool.  LC reviewed the doc flow sheets with parents and WNL for age.  Baby awake and rooting, LC offered to assist to latch, mom receptive.  Baby latched easily on the left breast with depth and fed with swallows and paused. Released for short interval and relatched. Baby STS with mom. Latch score 9  Mom aware to feed with feeding cues and by 3 hours STS .    Maternal Data Has patient been taught Hand Expression?: Yes (excellent flow of colostrum)  Feeding Mother's Current Feeding Choice: Breast Milk  LATCH Score Latch: Grasps breast easily, tongue down, lips flanged, rhythmical sucking.  Audible Swallowing: Spontaneous and intermittent  Type of Nipple: Everted at rest and after stimulation  Comfort (Breast/Nipple): Soft / non-tender  Hold (Positioning): Assistance needed to correctly position infant at breast and maintain latch.  LATCH Score: 9   Lactation Tools Discussed/Used Tools: Pump;Flanges Flange Size: 21;24 Breast pump type: Manual Pump Education: Milk Storage;Setup, frequency, and cleaning Reason for Pumping: PRN  Interventions Interventions: Breast feeding basics reviewed;Assisted with latch;Skin to skin;Breast massage;Hand express;Reverse pressure;Adjust position;Breast compression;Support pillows;Position options;Hand pump;Education;LC Services brochure  Discharge Pump: Personal;DEBP;Manual (mom showed me the [employer] on line)  Consult Status Consult Status: Follow-up Date: 02/19/23 Follow-up type: In-patient    Alexis Gray 02/18/2023, 9:11 AM

## 2023-02-18 NOTE — Progress Notes (Signed)
POSTPARTUM PROGRESS NOTE  PPD #1  Subjective:  Alexis Gray is a 22 y.o. G1P1001 s/p NSVD at 103w2d. Today she notes no acute complaints. She denies any problems with ambulating, voiding or po intake. Denies nausea or vomiting. She has passing flatus, + BM.  Pain is well controlled.  Lochia minimal Denies fever/chills/chest pain/SOB.   Objective: Blood pressure (!) 100/52, pulse 76, temperature 98.1 F (36.7 C), temperature source Oral, resp. rate 18, height 5\' 5"  (1.651 m), weight 80.3 kg, last menstrual period 04/29/2022, SpO2 100 %, unknown if currently breastfeeding.  Physical Exam:  General: alert, cooperative and no distress Chest: no respiratory distress Heart: regular rate and rhythm Abdomen: soft, nontender Uterine Fundus: firm, appropriately tender DVT Evaluation: No calf swelling or tenderness Extremities: no edema Skin: warm, dry  No results found for this or any previous visit (from the past 24 hour(s)).  Assessment/Plan: Alexis Gray is a 22 y.o. G1P1001 s/p NSVD at [redacted]w[redacted]d PPD#1  -pain well controlled -meeting postpartum milestones appropriately -desires circ- consent obtained, note in baby's chart  Contraception: outpatient IUD Feeding: breast  Dispo: Meeting postpartum milestones appropriately, possible discharge home later today pending baby's status   LOS: 2 days   Myna Hidalgo, DO Faculty Attending, Center for [facility] 02/18/2023, 7:59 AM

## 2023-02-19 NOTE — Lactation Note (Signed)
This note was copied from a baby's chart. Lactation Consultation Note  Patient Name: Alexis Gray [id number] Date: 02/19/2023 Age:21 hours Reason for consult: Follow-up assessment;Primapara;1st time breastfeeding;Term (per mom breast are filling heavier and fuller. Weight is stable) LC praised mom for exclusively breast feeding and dad for is support.  LC reviewed the doc flow sheets / WNL for age.  LC reviewed sore nipple,engorgement prevention and tx, steps for latching. Mom has the hand pump provided for her yesterday.  LC reviewed BF D/C teaching and the resources.   Maternal Data Has patient been taught Hand Expression?: Yes Does the patient have breastfeeding experience prior to this delivery?: No  Feeding Mother's Current Feeding Choice: Breast Milk  LATCH Score ( Latch score by the [facility] )  Latch: Grasps breast easily, tongue down, lips flanged, rhythmical sucking.  Audible Swallowing: Spontaneous and intermittent  Type of Nipple: Everted at rest and after stimulation  Comfort (Breast/Nipple): Soft / non-tender  Hold (Positioning): No assistance needed to correctly position infant at breast.  LATCH Score: 10   Lactation Tools Discussed/Used Tools: Pump;Flanges Flange Size: 21;24 Breast pump type: Manual Pump Education: Setup, frequency, and cleaning;Milk Storage Reason for Pumping: PRN  Interventions Interventions: Breast feeding basics reviewed;Hand pump;Education;LC Services brochure  Discharge Discharge Education: Engorgement and breast care;Warning signs for feeding baby Pump: Personal;Manual;DEBP  Consult Status Consult Status: Complete Date: 02/19/23    Kathrin Greathouse 02/19/2023, 12:51 PM

## 2023-02-23 ENCOUNTER — Other Ambulatory Visit: Payer: Medicaid Other

## 2023-02-23 ENCOUNTER — Encounter: Payer: Medicaid Other | Admitting: Obstetrics and Gynecology

## 2023-03-01 ENCOUNTER — Inpatient Hospital Stay (HOSPITAL_COMMUNITY): Payer: Medicaid Other

## 2023-03-13 ENCOUNTER — Telehealth (HOSPITAL_COMMUNITY): Payer: Self-pay | Admitting: *Deleted

## 2023-03-13 NOTE — Telephone Encounter (Signed)
03/13/2023  Name: Alexis Gray MRN: [id number] DOB: July 06, 2001  Reason for Call:  Transition of Care Hospital Discharge Call  Contact Status: Patient Contact Status: Complete  Language assistant needed:          Follow-Up Questions: Do You Have Any Concerns About Your Health As You Heal From Delivery?: No Do You Have Any Concerns About Your Infants Health?: No  Edinburgh Postnatal Depression Scale:  In the Past 7 Days:   EPDS not done at this time. Patient reported completing the EPDS at a pediatric appointment. Stated, "I don't need to do it again." PHQ2-9 Depression Scale:     Discharge Follow-up: Edinburgh score requires follow up?: N/A Patient was advised of the following resources:: Breastfeeding Support Group, Support Group Declined email information regarding support groups at this time. Post-discharge interventions: Reviewed Newborn Safe Sleep Practices  Signature Deforest Hoyles, RN, [phone]

## 2023-03-20 ENCOUNTER — Ambulatory Visit: Payer: Medicaid Other | Admitting: Obstetrics and Gynecology

## 2023-03-20 ENCOUNTER — Encounter (HOSPITAL_COMMUNITY): Payer: Self-pay | Admitting: Student

## 2023-04-25 ENCOUNTER — Ambulatory Visit: Payer: Medicaid Other | Admitting: Obstetrics & Gynecology

## 2023-05-09 ENCOUNTER — Encounter: Payer: Self-pay | Admitting: Pediatrics

## 2023-05-26 ENCOUNTER — Other Ambulatory Visit: Payer: Self-pay | Admitting: Obstetrics & Gynecology

## 2023-07-20 ENCOUNTER — Ambulatory Visit: Payer: Medicaid Other | Admitting: Nurse Practitioner

## 2023-08-31 ENCOUNTER — Encounter: Payer: Medicaid Other | Admitting: Family Medicine

## 2023-09-14 ENCOUNTER — Encounter: Payer: Medicaid Other | Admitting: Family Medicine

## 2023-09-14 ENCOUNTER — Ambulatory Visit: Payer: Medicaid Other | Admitting: Family Medicine

## 2024-01-04 ENCOUNTER — Encounter: Admitting: Family Medicine

## 2024-01-09 ENCOUNTER — Ambulatory Visit: Admitting: Family Medicine

## 2024-02-01 ENCOUNTER — Encounter: Admitting: Family Medicine
